# Patient Record
Sex: Male | Born: 1950 | Race: White | Hispanic: No | Smoking: Current every day smoker
Health system: Southern US, Community
[De-identification: ages and names within clinical notes are randomized; demographics above are authoritative.]

## PROBLEM LIST (undated history)

## (undated) DIAGNOSIS — I219 Acute myocardial infarction, unspecified: Secondary | ICD-10-CM

## (undated) DIAGNOSIS — IMO0001 Reserved for inherently not codable concepts without codable children: Secondary | ICD-10-CM

## (undated) DIAGNOSIS — K219 Gastro-esophageal reflux disease without esophagitis: Secondary | ICD-10-CM

## (undated) HISTORY — PX: CARDIAC CATHETERIZATION: SHX172

## (undated) HISTORY — PX: CORONARY ANGIOPLASTY: SHX604

---

## 2015-02-07 ENCOUNTER — Encounter (HOSPITAL_COMMUNITY)
Admission: EM | Disposition: A | Discharge status: Home / Self Care | Payer: Self-pay | Source: Home / Self Care | Attending: Interventional Cardiology

## 2015-02-07 ENCOUNTER — Other Ambulatory Visit: Payer: Self-pay

## 2015-02-07 ENCOUNTER — Encounter (HOSPITAL_COMMUNITY): Payer: Self-pay | Admitting: Emergency Medicine

## 2015-02-07 ENCOUNTER — Inpatient Hospital Stay (HOSPITAL_COMMUNITY)
Admission: EM | Admit: 2015-02-07 | Discharge: 2015-02-09 | DRG: 251 | Disposition: A | Discharge status: Home / Self Care | Payer: Self-pay | Attending: Interventional Cardiology | Admitting: Interventional Cardiology

## 2015-02-07 DIAGNOSIS — E785 Hyperlipidemia, unspecified: Secondary | ICD-10-CM | POA: Diagnosis present

## 2015-02-07 DIAGNOSIS — IMO0001 Reserved for inherently not codable concepts without codable children: Secondary | ICD-10-CM | POA: Diagnosis present

## 2015-02-07 DIAGNOSIS — Z7289 Other problems related to lifestyle: Secondary | ICD-10-CM | POA: Diagnosis present

## 2015-02-07 DIAGNOSIS — F1010 Alcohol abuse, uncomplicated: Secondary | ICD-10-CM | POA: Diagnosis present

## 2015-02-07 DIAGNOSIS — K219 Gastro-esophageal reflux disease without esophagitis: Secondary | ICD-10-CM | POA: Diagnosis present

## 2015-02-07 DIAGNOSIS — I2510 Atherosclerotic heart disease of native coronary artery without angina pectoris: Secondary | ICD-10-CM | POA: Diagnosis present

## 2015-02-07 DIAGNOSIS — I10 Essential (primary) hypertension: Secondary | ICD-10-CM | POA: Diagnosis present

## 2015-02-07 DIAGNOSIS — F1090 Alcohol use, unspecified, uncomplicated: Secondary | ICD-10-CM | POA: Diagnosis present

## 2015-02-07 DIAGNOSIS — Z720 Tobacco use: Secondary | ICD-10-CM

## 2015-02-07 DIAGNOSIS — I2119 ST elevation (STEMI) myocardial infarction involving other coronary artery of inferior wall: Principal | ICD-10-CM | POA: Diagnosis present

## 2015-02-07 DIAGNOSIS — Z789 Other specified health status: Secondary | ICD-10-CM | POA: Diagnosis present

## 2015-02-07 DIAGNOSIS — I252 Old myocardial infarction: Secondary | ICD-10-CM | POA: Diagnosis present

## 2015-02-07 DIAGNOSIS — Z7982 Long term (current) use of aspirin: Secondary | ICD-10-CM

## 2015-02-07 DIAGNOSIS — F17200 Nicotine dependence, unspecified, uncomplicated: Secondary | ICD-10-CM | POA: Diagnosis present

## 2015-02-07 DIAGNOSIS — I2111 ST elevation (STEMI) myocardial infarction involving right coronary artery: Secondary | ICD-10-CM

## 2015-02-07 DIAGNOSIS — I213 ST elevation (STEMI) myocardial infarction of unspecified site: Secondary | ICD-10-CM

## 2015-02-07 DIAGNOSIS — Z955 Presence of coronary angioplasty implant and graft: Secondary | ICD-10-CM

## 2015-02-07 DIAGNOSIS — F17210 Nicotine dependence, cigarettes, uncomplicated: Secondary | ICD-10-CM | POA: Diagnosis present

## 2015-02-07 HISTORY — DX: Acute myocardial infarction, unspecified: I21.9

## 2015-02-07 HISTORY — DX: Gastro-esophageal reflux disease without esophagitis: K21.9

## 2015-02-07 HISTORY — DX: Reserved for inherently not codable concepts without codable children: IMO0001

## 2015-02-07 HISTORY — PX: CARDIAC CATHETERIZATION: SHX172

## 2015-02-07 LAB — CK TOTAL AND CKMB (NOT AT ARMC)
CK, MB: 11.8 ng/mL — AB (ref 0.5–5.0)
Relative Index: 6 — ABNORMAL HIGH (ref 0.0–2.5)
Total CK: 196 U/L (ref 49–397)

## 2015-02-07 LAB — POCT I-STAT, CHEM 8
BUN: 16 mg/dL (ref 6–20)
CALCIUM ION: 1.19 mmol/L (ref 1.13–1.30)
CHLORIDE: 101 mmol/L (ref 101–111)
CREATININE: 1.1 mg/dL (ref 0.61–1.24)
Glucose, Bld: 101 mg/dL — ABNORMAL HIGH (ref 65–99)
HEMATOCRIT: 50 % (ref 39.0–52.0)
Hemoglobin: 17 g/dL (ref 13.0–17.0)
Potassium: 3.8 mmol/L (ref 3.5–5.1)
SODIUM: 140 mmol/L (ref 135–145)
TCO2: 27 mmol/L (ref 0–100)

## 2015-02-07 LAB — CBC WITH DIFFERENTIAL/PLATELET
BASOS ABS: 0 10*3/uL (ref 0.0–0.1)
Basophils Relative: 0 % (ref 0–1)
Eosinophils Absolute: 0.1 10*3/uL (ref 0.0–0.7)
Eosinophils Relative: 1 % (ref 0–5)
HCT: 45.6 % (ref 39.0–52.0)
Hemoglobin: 15.9 g/dL (ref 13.0–17.0)
Lymphocytes Relative: 27 % (ref 12–46)
Lymphs Abs: 2.5 10*3/uL (ref 0.7–4.0)
MCH: 33.9 pg (ref 26.0–34.0)
MCHC: 34.9 g/dL (ref 30.0–36.0)
MCV: 97.2 fL (ref 78.0–100.0)
Monocytes Absolute: 1 10*3/uL (ref 0.1–1.0)
Monocytes Relative: 11 % (ref 3–12)
Neutro Abs: 5.4 10*3/uL (ref 1.7–7.7)
Neutrophils Relative %: 61 % (ref 43–77)
Platelets: 173 10*3/uL (ref 150–400)
RBC: 4.69 MIL/uL (ref 4.22–5.81)
RDW: 13 % (ref 11.5–15.5)
WBC: 9 10*3/uL (ref 4.0–10.5)

## 2015-02-07 LAB — MRSA PCR SCREENING: MRSA by PCR: NEGATIVE

## 2015-02-07 LAB — POCT I-STAT TROPONIN I: Troponin i, poc: 0.05 ng/mL (ref 0.00–0.08)

## 2015-02-07 LAB — TROPONIN I: TROPONIN I: 1.28 ng/mL — AB (ref <0.031)

## 2015-02-07 LAB — POCT ACTIVATED CLOTTING TIME: Activated Clotting Time: 233 seconds

## 2015-02-07 SURGERY — LEFT HEART CATH AND CORONARY ANGIOGRAPHY
Anesthesia: LOCAL

## 2015-02-07 MED ORDER — HEPARIN BOLUS VIA INFUSION
4000.0000 [IU] | Freq: Once | INTRAVENOUS | Status: AC
  Administered 2015-02-07: 4000 [IU] via INTRAVENOUS

## 2015-02-07 MED ORDER — MIDAZOLAM HCL 2 MG/2ML IJ SOLN
INTRAMUSCULAR | Status: AC
  Filled 2015-02-07: qty 2

## 2015-02-07 MED ORDER — SODIUM CHLORIDE 0.9 % IV SOLN: 250.0000 mL | INTRAVENOUS | Status: DC | PRN

## 2015-02-07 MED ORDER — MIDAZOLAM HCL 2 MG/2ML IJ SOLN
INTRAMUSCULAR | Status: DC | PRN
  Administered 2015-02-07: 2 mg via INTRAVENOUS
  Administered 2015-02-07: 1 mg via INTRAVENOUS

## 2015-02-07 MED ORDER — NITROGLYCERIN 1 MG/10 ML FOR IR/CATH LAB
INTRA_ARTERIAL | Status: AC
  Filled 2015-02-07: qty 10

## 2015-02-07 MED ORDER — LORAZEPAM 1 MG PO TABS: 0.0000 mg | ORAL_TABLET | Freq: Four times a day (QID) | ORAL | Status: DC

## 2015-02-07 MED ORDER — POTASSIUM CHLORIDE CRYS ER 20 MEQ PO TBCR
20.0000 meq | EXTENDED_RELEASE_TABLET | Freq: Every day | ORAL | Status: DC
  Administered 2015-02-08 – 2015-02-09 (×2): 20 meq via ORAL
  Filled 2015-02-07 (×3): qty 1

## 2015-02-07 MED ORDER — ASPIRIN EC 81 MG PO TBEC
81.0000 mg | DELAYED_RELEASE_TABLET | Freq: Every day | ORAL | Status: DC
  Administered 2015-02-08: 81 mg via ORAL
  Filled 2015-02-07: qty 1

## 2015-02-07 MED ORDER — TICAGRELOR 90 MG PO TABS
ORAL_TABLET | ORAL | Status: DC | PRN
  Administered 2015-02-07: 180 mg via ORAL

## 2015-02-07 MED ORDER — FENTANYL CITRATE (PF) 100 MCG/2ML IJ SOLN
INTRAMUSCULAR | Status: AC
  Filled 2015-02-07: qty 2

## 2015-02-07 MED ORDER — NITROGLYCERIN IN D5W 200-5 MCG/ML-% IV SOLN
5.0000 ug/min | INTRAVENOUS | Status: DC
  Administered 2015-02-07: 5 ug/min via INTRAVENOUS
  Filled 2015-02-07: qty 250

## 2015-02-07 MED ORDER — SODIUM CHLORIDE 0.9 % IJ SOLN
3.0000 mL | Freq: Two times a day (BID) | INTRAMUSCULAR | Status: DC
  Administered 2015-02-07 – 2015-02-09 (×4): 3 mL via INTRAVENOUS

## 2015-02-07 MED ORDER — THIAMINE HCL 100 MG/ML IJ SOLN
100.0000 mg | Freq: Every day | INTRAMUSCULAR | Status: DC
  Administered 2015-02-09: 100 mg via INTRAVENOUS
  Filled 2015-02-07 (×3): qty 1

## 2015-02-07 MED ORDER — VITAMIN B-1 100 MG PO TABS
100.0000 mg | ORAL_TABLET | Freq: Every day | ORAL | Status: DC
  Administered 2015-02-07 – 2015-02-08 (×2): 100 mg via ORAL
  Filled 2015-02-07 (×3): qty 1

## 2015-02-07 MED ORDER — ADULT MULTIVITAMIN W/MINERALS CH
1.0000 | ORAL_TABLET | Freq: Every day | ORAL | Status: DC
  Administered 2015-02-07 – 2015-02-09 (×3): 1 via ORAL
  Filled 2015-02-07 (×4): qty 1

## 2015-02-07 MED ORDER — FENTANYL CITRATE (PF) 100 MCG/2ML IJ SOLN
INTRAMUSCULAR | Status: DC | PRN
  Administered 2015-02-07 (×2): 25 ug via INTRAVENOUS

## 2015-02-07 MED ORDER — ONDANSETRON HCL 4 MG/2ML IJ SOLN: 4.0000 mg | Freq: Four times a day (QID) | INTRAMUSCULAR | Status: DC | PRN

## 2015-02-07 MED ORDER — LORAZEPAM 1 MG PO TABS: 1.0000 mg | ORAL_TABLET | Freq: Four times a day (QID) | ORAL | Status: DC | PRN

## 2015-02-07 MED ORDER — NITROGLYCERIN 0.4 MG SL SUBL
0.4000 mg | SUBLINGUAL_TABLET | SUBLINGUAL | Status: DC | PRN
Start: 2015-02-07 — End: 2015-02-09

## 2015-02-07 MED ORDER — LORAZEPAM 2 MG/ML IJ SOLN: 1.0000 mg | Freq: Four times a day (QID) | INTRAMUSCULAR | Status: DC | PRN

## 2015-02-07 MED ORDER — METOPROLOL TARTRATE 12.5 MG HALF TABLET
12.5000 mg | ORAL_TABLET | Freq: Two times a day (BID) | ORAL | Status: DC
  Administered 2015-02-07 – 2015-02-09 (×3): 12.5 mg via ORAL
  Filled 2015-02-07 (×5): qty 1

## 2015-02-07 MED ORDER — IOHEXOL 350 MG/ML SOLN
INTRAVENOUS | Status: DC | PRN
  Administered 2015-02-07: 155 mL via INTRACARDIAC

## 2015-02-07 MED ORDER — ATORVASTATIN CALCIUM 80 MG PO TABS
80.0000 mg | ORAL_TABLET | Freq: Every day | ORAL | Status: DC
  Administered 2015-02-07 – 2015-02-08 (×2): 80 mg via ORAL
  Filled 2015-02-07 (×3): qty 1

## 2015-02-07 MED ORDER — ACETAMINOPHEN 325 MG PO TABS
650.0000 mg | ORAL_TABLET | ORAL | Status: DC | PRN
  Administered 2015-02-07: 650 mg via ORAL
  Filled 2015-02-07: qty 2

## 2015-02-07 MED ORDER — FOLIC ACID 1 MG PO TABS
1.0000 mg | ORAL_TABLET | Freq: Every day | ORAL | Status: DC
  Administered 2015-02-07 – 2015-02-09 (×3): 1 mg via ORAL
  Filled 2015-02-07 (×4): qty 1

## 2015-02-07 MED ORDER — HEPARIN SODIUM (PORCINE) 5000 UNIT/ML IJ SOLN
INTRAMUSCULAR | Status: AC
  Administered 2015-02-07: 4000 [IU]
  Filled 2015-02-07: qty 1

## 2015-02-07 MED ORDER — HEPARIN SODIUM (PORCINE) 1000 UNIT/ML IJ SOLN
INTRAMUSCULAR | Status: DC | PRN
  Administered 2015-02-07: 5000 [IU] via INTRAVENOUS
  Administered 2015-02-07: 3000 [IU] via INTRAVENOUS

## 2015-02-07 MED ORDER — LIDOCAINE HCL (PF) 1 % IJ SOLN
INTRAMUSCULAR | Status: DC | PRN
  Administered 2015-02-07: 2 mL via SUBCUTANEOUS

## 2015-02-07 MED ORDER — TICAGRELOR 90 MG PO TABS
ORAL_TABLET | ORAL | Status: AC
  Filled 2015-02-07: qty 2

## 2015-02-07 MED ORDER — LIDOCAINE HCL (PF) 1 % IJ SOLN
INTRAMUSCULAR | Status: DC | PRN
  Administered 2015-02-07: 18:00:00

## 2015-02-07 MED ORDER — PANTOPRAZOLE SODIUM 40 MG PO TBEC
40.0000 mg | DELAYED_RELEASE_TABLET | Freq: Every day | ORAL | Status: DC
  Administered 2015-02-07 – 2015-02-09 (×3): 40 mg via ORAL
  Filled 2015-02-07 (×3): qty 1

## 2015-02-07 MED ORDER — HEPARIN SODIUM (PORCINE) 1000 UNIT/ML IJ SOLN
INTRAMUSCULAR | Status: AC
  Filled 2015-02-07: qty 1

## 2015-02-07 MED ORDER — LORAZEPAM 1 MG PO TABS: 0.0000 mg | ORAL_TABLET | Freq: Two times a day (BID) | ORAL | Status: DC

## 2015-02-07 MED ORDER — VERAPAMIL HCL 2.5 MG/ML IV SOLN
INTRAVENOUS | Status: DC | PRN
  Administered 2015-02-07: 17:00:00 via INTRA_ARTERIAL

## 2015-02-07 MED ORDER — HEPARIN (PORCINE) IN NACL 2-0.9 UNIT/ML-% IJ SOLN
INTRAMUSCULAR | Status: AC
  Filled 2015-02-07: qty 1500

## 2015-02-07 MED ORDER — SODIUM CHLORIDE 0.9 % IJ SOLN: 3.0000 mL | INTRAMUSCULAR | Status: DC | PRN

## 2015-02-07 MED ORDER — LIDOCAINE HCL (PF) 1 % IJ SOLN
INTRAMUSCULAR | Status: AC
  Filled 2015-02-07: qty 30

## 2015-02-07 MED ORDER — TICAGRELOR 90 MG PO TABS
90.0000 mg | ORAL_TABLET | Freq: Two times a day (BID) | ORAL | Status: DC
  Administered 2015-02-08 – 2015-02-09 (×3): 90 mg via ORAL
  Filled 2015-02-07 (×5): qty 1

## 2015-02-07 MED ORDER — SODIUM CHLORIDE 0.9 % WEIGHT BASED INFUSION
1.0000 mL/kg/h | INTRAVENOUS | Status: AC
Start: 2015-02-07 — End: 2015-02-07

## 2015-02-07 SURGICAL SUPPLY — 17 items
BALLN EUPHORA RX 2.0X12 (BALLOONS) ×3
BALLOON EUPHORA RX 2.0X12 (BALLOONS) ×2 IMPLANT
CATH INFINITI 5 FR JL3.5 (CATHETERS) ×3 IMPLANT
CATH INFINITI 5FR ANG PIGTAIL (CATHETERS) ×3 IMPLANT
CATH INFINITI JR4 5F (CATHETERS) ×3 IMPLANT
DEVICE RAD COMP TR BAND LRG (VASCULAR PRODUCTS) ×3 IMPLANT
GLIDESHEATH SLEND SS 6F .021 (SHEATH) ×6 IMPLANT
GUIDE CATH RUNWAY 6FR FR4 (CATHETERS) ×3 IMPLANT
KIT ENCORE 26 ADVANTAGE (KITS) ×3 IMPLANT
KIT HEART LEFT (KITS) ×3 IMPLANT
PACK CARDIAC CATHETERIZATION (CUSTOM PROCEDURE TRAY) ×3 IMPLANT
SYR MEDRAD MARK V 150ML (SYRINGE) ×3 IMPLANT
TRANSDUCER W/STOPCOCK (MISCELLANEOUS) ×3 IMPLANT
TUBING CIL FLEX 10 FLL-RA (TUBING) ×3 IMPLANT
VALVE GUARDIAN II ~~LOC~~ HEMO (MISCELLANEOUS) ×3 IMPLANT
WIRE ASAHI PROWATER 180CM (WIRE) ×3 IMPLANT
WIRE SAFE-T 1.5MM-J .035X260CM (WIRE) ×3 IMPLANT

## 2015-02-07 NOTE — ED Notes (Signed)
Per EMS- Pt here with CP sudden onset 1 hour ago. Pt reports severe pressure, minimal relief with nirto. Pt endorses diaphoresis, SOB, diaphoreses. Pt recd 4 nitro, 324 ASA PTA. Pt has hx MI with 3 stents placed 4 years ago.

## 2015-02-07 NOTE — ED Notes (Signed)
CODE STEMI ACTIVATED @ 1630

## 2015-02-07 NOTE — H&P (Signed)
Patient ID: Noah Walker MRN: [id number], DOB/AGE: 09/11/1950   Admit date: 02/07/2015   Primary Physician: No primary care provider on file. Primary Cardiologist: Dr. Constance Haw [facility] cardiology at [facility]  Pt. Profile:  Noah Walker is a 64 y.o. male with known CAD s/p 3 stent  who presented to [facility] ED for evaluation of chest pain x 1 hour.    HPI: As above. The patient was presented to [facility] ED for evaluation of substernal chest pain x 1 hour, described as aching that radiated to his left arm. In ED he received 4 nitro and 324mg  ASA.The pain is constant and rates 8/10. He takes ASA everyday. He denies any SOB or palpitation.   His EKG showed ST elevated in inferior lead. Lytes are normal. POC trop normal. Cath 2015 showed mild nonobstructive disease, restenosis of RCA stent and EF of 45-505. Patient lives by him self. Wife died 5 years. Son is here but unable to provide detailed history, only think he know that patient smokes about 1-2 pack a day and alcohol drinking. He live nearby to him.   Problem List  Past Medical History  Diagnosis Date  . MI (myocardial infarction)   . Reflux     Past Surgical History  Procedure Laterality Date  . Cardiac catheterization       Allergies  No Known Allergies   Home Medications  Prior to Admission medications   Medication Sig Start Date End Date Taking? Authorizing Provider  aspirin 81 MG tablet Take 81 mg by mouth daily.   Yes Historical Provider, MD  nitroGLYCERIN (NITROSTAT) 0.4 MG SL tablet Place 0.4 mg under the tongue every 5 (five) minutes as needed for chest pain.   Yes Historical Provider, MD  Omega-3 Fatty Acids (FISH OIL) 1000 MG CAPS Take 1,000 mg by mouth daily.   Yes Historical Provider, MD  omeprazole (PRILOSEC) 20 MG capsule Take 20 mg by mouth daily.   Yes Historical Provider, MD  potassium chloride SA (K-DUR,KLOR-CON) 20 MEQ tablet Take 20 mEq by mouth daily.   Yes Historical Provider, MD    Family  History  History reviewed. No pertinent family history. No family status information on file.     Social History  History   Social History  . Marital Status: N/A    Spouse Name: N/A  . Number of Children: N/A  . Years of Education: N/A   Occupational History  . Not on file.   Social History Main Topics  . Smoking status: Current Every Day Smoker -- 1.00 packs/day    Types: Cigarettes  . Smokeless tobacco: Not on file  . Alcohol Use: Yes  . Drug Use: No  . Sexual Activity: Not on file   Other Topics Concern  . Not on file   Social History Narrative  . No narrative on file      All other systems reviewed and are otherwise negative except as noted above.  Physical Exam  Pulse 62, temperature 98.4 F (36.9 C), temperature source Oral, resp. rate 18, height 5\' 11"  (1.803 m), weight 220 lb (99.791 kg), SpO2 98 %.  General: Pleasant, NAD Psych: Normal affect. Neuro: Alert and oriented X 3. Moves all extremities spontaneously. HEENT: Normal  Neck: Supple without bruits or JVD. Lungs:  Resp regular and unlabored, CTA. Heart: RRR no s3, s4, or murmurs. Abdomen: Soft, non-tender, non-distended, BS + x 4.  Extremities: No clubbing, cyanosis or edema. DP/PT/Radials 2+ and equal bilaterally.  Labs  No results for input(s): CKTOTAL, CKMB, TROPONINI in the last 72 hours. Lab Results  Component Value Date   HGB 17.0 02/07/2015   HCT 50.0 02/07/2015    Recent Labs Lab 02/07/15 1645  NA 140  K 3.8  CL 101  BUN 16  CREATININE 1.10  GLUCOSE 101*    Radiology/Studies  No results found.  ECG: ST elevation of inferior lead.  ASSESSMENT AND PLAN  1. Inferior STEMI - Presented with 1 hour of chest pain. Hx of 2 cath in past one in 2012 and second in 2015.  - He was started on heparin bolus and infusion.  - Will get Lipid panel, cycle trop, serial ekgs, HgbA1C, echocardiogram, UDS - Will admit to ICU  2. Alcohol abuse - Son provided hx of alcohol  abuse. -Will place him on a CIWA protocol.  3. Current tobacco abuse - Need education  Kathalene Frames 02/07/2015, 4:54 PM Pager [phone]   I have examined the patient and reviewed assessment and plan and discussed with patient.  Agree with above as stated.  Plan for emergent cardiac catheterization. Further plans based on results of cath. Of note, he has had issues with taking long-term antiplatelets therapy in the past. He has also had irregular follow-up with his cardiologist. This may limit our revascularization options. Likely to the CCU postprocedure.  Eliaz Fout S.

## 2015-02-07 NOTE — ED Notes (Signed)
Pt transported to cath lab. Maralyn Sago, RN to transport.

## 2015-02-07 NOTE — ED Provider Notes (Signed)
CSN: [id number]     Arrival date & time 02/07/15  1629 History   First MD Initiated Contact with Patient 02/07/15 1631     Chief Complaint  Patient presents with  . Chest Pain     (Consider location/radiation/quality/duration/timing/severity/associated sxs/prior Treatment) Patient is a 64 y.o. male presenting with chest pain. The history is provided by the patient (the pt complains of chest pain for one hour,  hx of 3 stents).  Chest Pain Pain location:  Substernal area Pain quality: aching   Pain radiates to:  L arm Pain radiates to the back: yes   Pain severity:  Moderate Onset quality:  Sudden Timing:  Constant Chronicity:  New Context: not breathing   Associated symptoms: no abdominal pain, no back pain, no cough, no fatigue and no headache     No past medical history on file. No past surgical history on file. No family history on file. History  Substance Use Topics  . Smoking status: Not on file  . Smokeless tobacco: Not on file  . Alcohol Use: Not on file    Review of Systems  Constitutional: Negative for appetite change and fatigue.  HENT: Negative for congestion, ear discharge and sinus pressure.   Eyes: Negative for discharge.  Respiratory: Negative for cough.   Cardiovascular: Positive for chest pain.  Gastrointestinal: Negative for abdominal pain and diarrhea.  Genitourinary: Negative for frequency and hematuria.  Musculoskeletal: Negative for back pain.  Skin: Negative for rash.  Neurological: Negative for seizures and headaches.  Psychiatric/Behavioral: Negative for hallucinations.      Allergies  Review of patient's allergies indicates not on file.  Home Medications   Prior to Admission medications   Not on File   SpO2 95% Physical Exam  Constitutional: He is oriented to person, place, and time. He appears well-developed.  HENT:  Head: Normocephalic.  Eyes: Conjunctivae and EOM are normal. No scleral icterus.  Neck: Neck supple. No  thyromegaly present.  Cardiovascular: Normal rate and regular rhythm.  Exam reveals no gallop and no friction rub.   No murmur heard. Pulmonary/Chest: No stridor. He has no wheezes. He has no rales. He exhibits no tenderness.  Abdominal: He exhibits no distension. There is no tenderness. There is no rebound.  Musculoskeletal: Normal range of motion. He exhibits no edema.  Lymphadenopathy:    He has no cervical adenopathy.  Neurological: He is oriented to person, place, and time. He exhibits normal muscle tone. Coordination normal.  Skin: No rash noted. No erythema.  Psychiatric: He has a normal mood and affect. His behavior is normal.    ED Course  Procedures (including critical care time) Labs Review Labs Reviewed - No data to display  Imaging Review No results found.  ekg shows acute stemi inf mi CRITICAL CARE Performed by: Amena Dockham L Total critical care time: 10 Critical care time was exclusive of separately billable procedures and treating other patients. Critical care was necessary to treat or prevent imminent or life-threatening deterioration. Critical care was time spent personally by me on the following activities: development of treatment plan with patient and/or surrogate as well as nursing, discussions with consultants, evaluation of patient's response to treatment, examination of patient, obtaining history from patient or surrogate, ordering and performing treatments and interventions, ordering and review of laboratory studies, ordering and review of radiographic studies, pulse oximetry and re-evaluation of patient's condition.  MDM   Final diagnoses:  None    STEmi  To cath lab    [facility],  MD 02/07/15 1645

## 2015-02-08 ENCOUNTER — Encounter (HOSPITAL_COMMUNITY): Payer: Self-pay | Admitting: Interventional Cardiology

## 2015-02-08 DIAGNOSIS — E785 Hyperlipidemia, unspecified: Secondary | ICD-10-CM

## 2015-02-08 LAB — CK TOTAL AND CKMB (NOT AT ARMC)
CK TOTAL: 343 U/L (ref 49–397)
CK, MB: 29.4 ng/mL — ABNORMAL HIGH (ref 0.5–5.0)
CK, MB: 32.1 ng/mL — ABNORMAL HIGH (ref 0.5–5.0)
RELATIVE INDEX: 9.4 — AB (ref 0.0–2.5)
Relative Index: 8.3 — ABNORMAL HIGH (ref 0.0–2.5)
Total CK: 354 U/L (ref 49–397)

## 2015-02-08 LAB — CBC
HCT: 45 % (ref 39.0–52.0)
HEMATOCRIT: 44.1 % (ref 39.0–52.0)
Hemoglobin: 15.2 g/dL (ref 13.0–17.0)
Hemoglobin: 15.4 g/dL (ref 13.0–17.0)
MCH: 33.9 pg (ref 26.0–34.0)
MCH: 33.2 pg (ref 26.0–34.0)
MCHC: 34.5 g/dL (ref 30.0–36.0)
MCHC: 34.2 g/dL (ref 30.0–36.0)
MCV: 98.2 fL (ref 78.0–100.0)
MCV: 97 fL (ref 78.0–100.0)
PLATELETS: 139 10*3/uL — AB (ref 150–400)
Platelets: 151 10*3/uL (ref 150–400)
RBC: 4.49 MIL/uL (ref 4.22–5.81)
RBC: 4.64 MIL/uL (ref 4.22–5.81)
RDW: 13 % (ref 11.5–15.5)
RDW: 13.1 % (ref 11.5–15.5)
WBC: 8.8 10*3/uL (ref 4.0–10.5)
WBC: 8.2 10*3/uL (ref 4.0–10.5)

## 2015-02-08 LAB — LIPID PANEL
CHOLESTEROL: 176 mg/dL (ref 0–200)
HDL: 37 mg/dL — ABNORMAL LOW (ref >40)
LDL Cholesterol: 113 mg/dL — ABNORMAL HIGH (ref 0–99)
Total CHOL/HDL Ratio: 4.8 RATIO
Triglycerides: 131 mg/dL (ref <150)
VLDL: 26 mg/dL (ref 0–40)

## 2015-02-08 LAB — RAPID URINE DRUG SCREEN, HOSP PERFORMED
Amphetamines: NOT DETECTED
BARBITURATES: NOT DETECTED
Benzodiazepines: POSITIVE — AB
COCAINE: NOT DETECTED
OPIATES: NOT DETECTED
TETRAHYDROCANNABINOL: NOT DETECTED

## 2015-02-08 LAB — BASIC METABOLIC PANEL
Anion gap: 9 (ref 5–15)
BUN: 11 mg/dL (ref 6–20)
CHLORIDE: 103 mmol/L (ref 101–111)
CO2: 22 mmol/L (ref 22–32)
Calcium: 8.8 mg/dL — ABNORMAL LOW (ref 8.9–10.3)
Creatinine, Ser: 0.93 mg/dL (ref 0.61–1.24)
GFR calc Af Amer: >60 mL/min (ref >60)
GFR calc non Af Amer: >60 mL/min (ref >60)
Glucose, Bld: 90 mg/dL (ref 65–99)
Potassium: 3.5 mmol/L (ref 3.5–5.1)
SODIUM: 134 mmol/L — AB (ref 135–145)

## 2015-02-08 MED ORDER — AMLODIPINE BESYLATE 5 MG PO TABS
5.0000 mg | ORAL_TABLET | Freq: Every day | ORAL | Status: DC
  Administered 2015-02-08 – 2015-02-09 (×2): 5 mg via ORAL
  Filled 2015-02-08 (×3): qty 1

## 2015-02-08 MED ORDER — PNEUMOCOCCAL VAC POLYVALENT 25 MCG/0.5ML IJ INJ
0.5000 mL | INJECTION | INTRAMUSCULAR | Status: DC
  Filled 2015-02-08: qty 0.5

## 2015-02-08 MED FILL — Heparin Sodium (Porcine) 2 Unit/ML in Sodium Chloride 0.9%: INTRAMUSCULAR | Qty: 500 | Status: AC

## 2015-02-08 MED FILL — Nitroglycerin IV Soln 100 MCG/ML in D5W: INTRA_ARTERIAL | Qty: 10 | Status: AC

## 2015-02-08 NOTE — Progress Notes (Signed)
Primary cardiology  = [facility] in [place],  [facility] in [place]   PROGRESS NOTE  Subjective:   Noah Walker is a 64 y.o. male with known CAD s/p 3 stent who presented to [facility] ED for evaluation of chest pain x 1 hour.   He had PCI  of the PL branch - only partially successful.     Cath revealed:  Mid RCA lesion, 20% in stent restenosis.  Prox LAD to Mid LAD lesion, 50% stenosed.  1st RPLB lesion, 100% stenosed which was the culprit for his presentation. He was treated with balloon angioplasty and his symptoms improved. Please see below.  The left ventricular systolic function is normal.                                                              He had PCI  of the PL branch - only partially successful.  He is feeling well this am     Objective:    Vital Signs:   Temp:  [97.7 F (36.5 C)-98.6 F (37 C)] 97.7 F (36.5 C) (07/14 0800) Pulse Rate:  [0-69] 62 (07/14 0902) Resp:  [0-31] 13 (07/13 2100) BP: (122-168)/(49-99) 154/91 mmHg (07/14 0902) SpO2:  [0 %-100 %] 94 % (07/14 0800) Weight:  [99.791 kg (220 lb)] 99.791 kg (220 lb) (07/13 1635)      24-hour weight change: Weight change:   Weight trends: Filed Weights   02/07/15 1635  Weight: 99.791 kg (220 lb)    Intake/Output:  07/13 0701 - 07/14 0700 In: 1124.7 [P.O.:720; I.V.:404.7] Out: -  Total I/O In: 240 [P.O.:240] Out: -    Physical Exam: BP 154/91 mmHg  Pulse 62  Temp(Src) 97.7 F (36.5 C) (Oral)  Resp 13  Ht 5\' 11"  (1.803 m)  Wt 99.791 kg (220 lb)  BMI 30.70 kg/m2  SpO2 94%  Wt Readings from Last 3 Encounters:  02/07/15 99.791 kg (220 lb)    General: Vital signs reviewed and noted.   Head: Normocephalic, atraumatic.  Eyes: conjunctivae/corneas clear.  EOM's intact.   Throat: normal  Neck:  normal   Lungs:    clear   Heart:   RR   Abdomen:  Soft, non-tender, non-distended    Extremities: Right radial cath site is ok    Neurologic: A&O X3, CN II - XII are grossly  intact.   Psych: Normal     Labs: BMET:  Recent Labs  02/07/15 1645 02/08/15 0132  NA 140 134*  K 3.8 3.5  CL 101 103  CO2  --  22  GLUCOSE 101* 90  BUN 16 11  CREATININE 1.10 0.93  CALCIUM  --  8.8*    Liver function tests: No results for input(s): AST, ALT, ALKPHOS, BILITOT, PROT, ALBUMIN in the last 72 hours. No results for input(s): LIPASE, AMYLASE in the last 72 hours.  CBC:  Recent Labs  02/07/15 1635  02/08/15 0250 02/08/15 0743  WBC 9.0  --  8.8 8.2  NEUTROABS 5.4  --   --   --   HGB 15.9  < > 15.2 15.4  HCT 45.6  < > 44.1 45.0  MCV 97.2  --  98.2 97.0  PLT 173  --  139* 151  < > = values in this  interval not displayed.  Cardiac Enzymes:  Recent Labs  02/07/15 1947 02/08/15 0132  CKTOTAL 196 354  CKMB 11.8* 29.4*  TROPONINI 1.28*  --     Coagulation Studies: No results for input(s): LABPROT, INR in the last 72 hours.  Other: Invalid input(s): POCBNP No results for input(s): DDIMER in the last 72 hours. No results for input(s): HGBA1C in the last 72 hours.  Recent Labs  02/08/15 0132  CHOL 176  HDL 37*  LDLCALC 113*  TRIG 131  CHOLHDL 4.8   No results for input(s): TSH, T4TOTAL, T3FREE, THYROIDAB in the last 72 hours.  Invalid input(s): FREET3 No results for input(s): VITAMINB12, FOLATE, FERRITIN, TIBC, IRON, RETICCTPCT in the last 72 hours.   Other results:  EKG  ( personally reviewed )  -sinus brady at 53.  Small inf . Q waves     Medications:    Infusions: . nitroGLYCERIN Stopped (02/07/15 2100)    Scheduled Medications: . aspirin EC  81 mg Oral Daily  . atorvastatin  80 mg Oral q1800  . folic acid  1 mg Oral Daily  . LORazepam  0-4 mg Oral Q6H   Followed by  . [START ON 02/09/2015] LORazepam  0-4 mg Oral Q12H  . metoprolol tartrate  12.5 mg Oral BID  . multivitamin with minerals  1 tablet Oral Daily  . pantoprazole  40 mg Oral Daily  . [START ON 02/09/2015] pneumococcal 23 valent vaccine  0.5 mL Intramuscular  Tomorrow-1000  . potassium chloride SA  20 mEq Oral Daily  . sodium chloride  3 mL Intravenous Q12H  . thiamine  100 mg Oral Daily   Or  . thiamine  100 mg Intravenous Daily  . ticagrelor  90 mg Oral BID    Assessment/ Plan:   Active Problems:   Acute inferior myocardial infarction  1. Acute Inf. MI:  S/p angioplasty of the small PL branch. Was not stented  Patient is feeling well today  Transfer to floor. Anticipate DC tomorrow  Continue asa and brilinta for 1 month   2. Hyperlipidemia:  LDL is 113.  Has been started on atorvastatin 80 .     Disposition: anticipate DC tomorrow  Length of Stay: 1  Vesta Mixer, Montez Hageman., MD, [facility] 02/08/2015, 9:40 AM Office [phone] Pager [phone]

## 2015-02-08 NOTE — Progress Notes (Signed)
Patient ambulated 4 laps around the unit with cardiac rehad nurse. Upon completion of ambulation patient c/o chest pain rating 2/10 associated with dizziness and nausea. BP 138/97 HR 53 with feeling of inability to catch breath at times. Corine Shelter PA notified and made aware of the incident and that the chest pain has now subsided. Discharge has been cancelled and orders have been entered for Norvasc. Will continue to monitor patient.

## 2015-02-08 NOTE — Progress Notes (Signed)
Utilization Review Completed.Derico Mitton T7/14/2016  

## 2015-02-08 NOTE — Progress Notes (Signed)
CARDIAC REHAB PHASE I   PRE:  Rate/Rhythm: 52 SB  BP:  Sitting: 139/86        SaO2: 96 RA  MODE:  Ambulation: 1400 ft  POST:  Rate/Rhythm: 69 SR  BP:  Sitting: 138/97       SaO2: 98 RA  Pt lying in bed, states he is tired. Pt states "you people don't let me sleep." Agreeable to ambulate. Pt ambulated 1400 ft on RA, independent, brisk, steady gait, declined rest stop. Pt tolerated ambulation well however, upon returning to room  pt began to c/o of 2/10 chest pain and moderate dizziness, RN notified. Pt cp resolved with rest. Pt denies DOE but does state he has felt short of breath at rest, like he can't catch his breath. Described as breathlessness, may be a side effect of his brilinta, advised pt to discuss this with MD. Completed MI/PCI education.  Reviewed risk factors, tobacco cessation, anti-platelet therapy, activity restrictions, ntg, exercise, heart healthy diet, and phase 2 cardiac rehab. Pt verbalized understanding. Pt declines  phase 2 cardiac rehab, states he is not interested and cannot afford to miss work. Pt reports hx of non-compliance with diet, medications, also states he smokes a pack of cigarettes a day (previously quit for 6 months after his first heart attack) and sometimes drinks 10 beers a night for several days in a row. Pt states "I should not be alive. After my wife died I stopped taking care of myself."  Pt verbalized understanding of importance of medication compliance and lifestyle changes. Pt states he has no other questions at this time. Pt to bed after walk per pt request, call bell within reach. Will follow-up tomorrow.    [id number]  Joylene Grapes, RN, BSN 02/08/2015 12:47 PM

## 2015-02-08 NOTE — Progress Notes (Signed)
Spoke w pt. He lives in [place] He has no ins at present. Have him inform on [facility] [place] and also on [place] [facility] and inform [facility]. Gave pt 30day free brilinta card. Placed brilinta pt assist form on shadow chart for md to sign.

## 2015-02-09 ENCOUNTER — Encounter (HOSPITAL_COMMUNITY): Payer: Self-pay | Admitting: Interventional Cardiology

## 2015-02-09 DIAGNOSIS — Z7289 Other problems related to lifestyle: Secondary | ICD-10-CM

## 2015-02-09 DIAGNOSIS — F17200 Nicotine dependence, unspecified, uncomplicated: Secondary | ICD-10-CM

## 2015-02-09 DIAGNOSIS — Z789 Other specified health status: Secondary | ICD-10-CM

## 2015-02-09 DIAGNOSIS — I10 Essential (primary) hypertension: Secondary | ICD-10-CM | POA: Diagnosis present

## 2015-02-09 DIAGNOSIS — F1090 Alcohol use, unspecified, uncomplicated: Secondary | ICD-10-CM

## 2015-02-09 DIAGNOSIS — E785 Hyperlipidemia, unspecified: Secondary | ICD-10-CM | POA: Diagnosis present

## 2015-02-09 DIAGNOSIS — IMO0001 Reserved for inherently not codable concepts without codable children: Secondary | ICD-10-CM

## 2015-02-09 HISTORY — DX: Hyperlipidemia, unspecified: E78.5

## 2015-02-09 HISTORY — DX: Reserved for inherently not codable concepts without codable children: IMO0001

## 2015-02-09 HISTORY — DX: Essential (primary) hypertension: I10

## 2015-02-09 HISTORY — DX: Alcohol use, unspecified, uncomplicated: F10.90

## 2015-02-09 HISTORY — DX: Nicotine dependence, unspecified, uncomplicated: F17.200

## 2015-02-09 HISTORY — DX: Other problems related to lifestyle: Z72.89

## 2015-02-09 HISTORY — DX: Other specified health status: Z78.9

## 2015-02-09 LAB — HEMOGLOBIN A1C
HEMOGLOBIN A1C: 5.4 % (ref 4.8–5.6)
Mean Plasma Glucose: 108 mg/dL

## 2015-02-09 MED ORDER — PRAVASTATIN SODIUM 40 MG PO TABS: 40.0000 mg | ORAL_TABLET | Freq: Every day | ORAL | Status: DC

## 2015-02-09 MED ORDER — FOLIC ACID 1 MG PO TABS: 1.0000 mg | ORAL_TABLET | Freq: Every day | ORAL | Status: DC

## 2015-02-09 MED ORDER — THIAMINE HCL 100 MG PO TABS: 100.0000 mg | ORAL_TABLET | Freq: Every day | ORAL | Status: DC

## 2015-02-09 MED ORDER — TICAGRELOR 90 MG PO TABS: 90.0000 mg | ORAL_TABLET | Freq: Two times a day (BID) | ORAL | Status: DC

## 2015-02-09 MED ORDER — ADULT MULTIVITAMIN W/MINERALS CH: 1.0000 | ORAL_TABLET | Freq: Every day | ORAL | Status: DC

## 2015-02-09 MED ORDER — AMLODIPINE BESYLATE 5 MG PO TABS: 5.0000 mg | ORAL_TABLET | Freq: Every day | ORAL | Status: DC

## 2015-02-09 MED ORDER — METOPROLOL TARTRATE 25 MG PO TABS: 12.5000 mg | ORAL_TABLET | Freq: Two times a day (BID) | ORAL | Status: DC

## 2015-02-09 NOTE — Progress Notes (Signed)
Orde for discharge verified and pt informed and verbalized understanding. Discharge instructions gone over with pt and this evening's med outlined, pt verbalized understanding. Reiterated the importance of taking meds: i.e. Brilinta. IV access discontinued. Elink notified. Pt transported to waiting car via wheelchair with belongings. Will cont to monitor

## 2015-02-09 NOTE — Progress Notes (Signed)
1130 Observed pt up in hall walking with his RN. Education was completed yesterday with pt. Read note that pt wants to go home. Luetta Nutting RN BSN 02/09/2015 11:37 AM

## 2015-02-09 NOTE — Progress Notes (Addendum)
Subjective:    Day of hospitalization: 2  VSS.  No overnight events.  Pt denies further episodes of CP.  Walked 2 laps around the unit.  He wants to go home.    Objective:   Temp:  [97.7 F (36.5 C)-98.1 F (36.7 C)] 97.7 F (36.5 C) (07/15 0734) Pulse Rate:  [50-63] 56 (07/15 1000) Resp:  [20] 20 (07/15 0800) BP: (117-163)/(52-97) 134/55 mmHg (07/15 1006) SpO2:  [93 %-100 %] 96 % (07/15 1000) Last BM Date: 02/08/15  Filed Weights   02/07/15 1635  Weight: 99.791 kg (220 lb)    Intake/Output Summary (Last 24 hours) at 02/09/15 1133 Last data filed at 02/09/15 0800  Gross per 24 hour  Intake   1055 ml  Output      0 ml  Net   1055 ml    Physical Exam: General: NAD, sitting up in the chair.  HEENT: Conjunctiva and lids normal, oropharynx clear. Lungs: CTAB, nonlabored. Cardiac: RRR, no m/r/g. Abdomen: +BS, NT/ND.   Extremities: No LE edema. Radial cath site without hematoma. Neuro: Alert and oriented x3. Moving all extremities.   Lab Results:  Basic Metabolic Panel:  Recent Labs Lab 02/07/15 1645 02/08/15 0132  NA 140 134*  K 3.8 3.5  CL 101 103  CO2  --  22  GLUCOSE 101* 90  BUN 16 11  CREATININE 1.10 0.93  CALCIUM  --  8.8*    Liver Function Tests: No results for input(s): AST, ALT, ALKPHOS, BILITOT, PROT, ALBUMIN in the last 168 hours.  CBC:  Recent Labs Lab 02/07/15 1635 02/07/15 1645 02/08/15 0250 02/08/15 0743  WBC 9.0  --  8.8 8.2  HGB 15.9 17.0 15.2 15.4  HCT 45.6 50.0 44.1 45.0  MCV 97.2  --  98.2 97.0  PLT 173  --  139* 151    Cardiac Enzymes:  Recent Labs Lab 02/07/15 1947 02/08/15 0132 02/08/15 0743  CKTOTAL 196 354 343  CKMB 11.8* 29.4* 32.1*  TROPONINI 1.28*  --   --     BNP: No results for input(s): PROBNP in the last 8760 hours.  Coagulation: No results for input(s): INR in the last 168 hours.  Radiology: No results found.   ECG:   Medications:   Scheduled Medications: . amLODipine  5 mg Oral  Daily  . atorvastatin  80 mg Oral q1800  . folic acid  1 mg Oral Daily  . LORazepam  0-4 mg Oral Q6H   Followed by  . LORazepam  0-4 mg Oral Q12H  . metoprolol tartrate  12.5 mg Oral BID  . multivitamin with minerals  1 tablet Oral Daily  . pantoprazole  40 mg Oral Daily  . pneumococcal 23 valent vaccine  0.5 mL Intramuscular Tomorrow-1000  . potassium chloride SA  20 mEq Oral Daily  . sodium chloride  3 mL Intravenous Q12H  . thiamine  100 mg Oral Daily   Or  . thiamine  100 mg Intravenous Daily  . ticagrelor  90 mg Oral BID    Infusions:    PRN Medications: sodium chloride, acetaminophen, LORazepam **OR** LORazepam, nitroGLYCERIN, ondansetron (ZOFRAN) IV, sodium chloride   Assessment and Plan:   Acute inferior MI s/p angioplasty  LHC revealed occlusion of terminal portion of PLA treated with angioplasty which was partically successful.  No stenting.  Denies CP this AM and is ready to go home.  -cont ASA/brilinta x 1 month -cont atorvastatin 80mg   -cont metoprolol 12.5mg  bid  HTN -cont norvasic  -cont atorvastatin    Hyperlipidemia -cont atorvastatin   Alcohol use -cont thiamine, folate    Noah Salvage, MD PGY-3, Internal Medicine Teaching Service 02/09/2015, 11:33 AM   Attending Note:   The patient was seen and examined.  Agree with assessment and plan as noted above.  Changes made to the above note as needed.  Has done well. No angina Has ambulated without problems    Vesta Mixer, Montez Hageman., MD, [facility] 02/09/2015, 12:00 PM [address]  [address] Office [phone] Pager [phone]

## 2015-02-09 NOTE — Progress Notes (Signed)
Spoke w pt again. Went over need to take brilinta. He has 30day card free and his [facility] has brilinta in stock. He was given signed for for pt assist form to place proof of income and mail in to get brilinta thru drug company.

## 2015-02-09 NOTE — Discharge Summary (Signed)
Name: BRYCE GILLMORE MRN: [id number] DOB: 1951-01-04 64 y.o. PCP: Lorin Picket. Fabienne Bruns, MD ______________________________________________________________  Date of Admission: 02/07/2015  4:29 PM Date of Discharge: 02/09/2015 Attending Physician: Corky Crafts, MD   Discharge Diagnosis: Patient Active Problem List   Diagnosis Date Noted  . Essential hypertension 02/09/2015  . Hyperlipidemia 02/09/2015  . Smoking 02/09/2015  . Alcohol use 02/09/2015  . Acute inferior myocardial infarction 02/07/2015     Discharge Medications:   Medication List    TAKE these medications        amLODipine 5 MG tablet  Commonly known as:  NORVASC  Take 1 tablet (5 mg total) by mouth daily.     aspirin 81 MG tablet  Take 81 mg by mouth daily.     Fish Oil 1000 MG Caps  Take 1,000 mg by mouth daily.     folic acid 1 MG tablet  Commonly known as:  FOLVITE  Take 1 tablet (1 mg total) by mouth daily.     metoprolol tartrate 25 MG tablet  Commonly known as:  LOPRESSOR  Take 0.5 tablets (12.5 mg total) by mouth 2 (two) times daily.     multivitamin with minerals Tabs tablet  Take 1 tablet by mouth daily.     nitroGLYCERIN 0.4 MG SL tablet  Commonly known as:  NITROSTAT  Place 0.4 mg under the tongue every 5 (five) minutes as needed for chest pain.     omeprazole 20 MG capsule  Commonly known as:  PRILOSEC  Take 20 mg by mouth daily.     potassium chloride SA 20 MEQ tablet  Commonly known as:  K-DUR,KLOR-CON  Take 20 mEq by mouth daily.     pravastatin 40 MG tablet  Commonly known as:  PRAVACHOL  Take 1 tablet (40 mg total) by mouth daily.     thiamine 100 MG tablet  Take 1 tablet (100 mg total) by mouth daily.     ticagrelor 90 MG Tabs tablet  Commonly known as:  BRILINTA  Take 1 tablet (90 mg total) by mouth 2 (two) times daily.     vitamin C 250 MG tablet  Commonly known as:  ASCORBIC ACID  Take 250 mg by mouth daily.        Disposition and follow-up:     Mr.Lionel C Gutter was discharged from [facility] in stable condition to home.  Please address the following problems post-discharge:  1.Compliance with medications including BP and cholesterol medications 2.Smoking cessation  3.Alcohol use 4.Follow up with cardiology     Labs / imaging needed at time of follow-up: None   Pending labs/ test needing follow-up: None  Follow-up Appointments: Follow-up Information    Follow up with Arletta Bale, MD. Call today.   Specialty:  Family Medicine   Why:  A hospital follow up appointment   Contact information:   [address] [place] [address]       Discharge Instructions: Discharge Instructions    Call MD for:  severe uncontrolled pain    Complete by:  As directed      Diet - low sodium heart healthy    Complete by:  As directed      Discharge instructions    Complete by:  As directed   Follow up with your primary care doctor within 1 week after you leave the hospital.     Increase activity slowly    Complete by:  As directed  Consultations:    Procedures Performed:  No results found.  2D Echo: N/A  Cardiac Cath 02/07/2015:   Mid RCA lesion, 20% in stent restenosis.  Prox LAD to Mid LAD lesion, 50% stenosed.  1st RPLB lesion, 100% stenosed which was the culprit for his presentation. He was treated with   balloon angioplasty and his symptoms improved. The flow improved somewhat but the occlusion  was not completely cleared. His pain improved significantly post procedure so we stopped the  procedure at that point. Since it was a small vessel and his symptoms improved, I felt that the  risk of further attempts at angioplasty was greater than any potential benefit.  The left ventricular systolic function is normal.  Admission HPI:  Jamesrobert Ohanesian is a 64 y.o. male with known CAD s/p 3 stent who presented to [facility] ED for evaluation of chest pain x 1 hour.   The patient was  presented to [facility] ED for evaluation of substernal chest pain x 1 hour, described as aching that radiated to his left arm. In ED he received 4 nitro and  ASA.The pain is constant and rates 8/10. He takes ASA everyday. He denies any SOB or palpitation.   His EKG showed ST elevated in inferior lead. Lytes are normal. POC trop normal. Cath 2015 showed mild nonobstructive disease, restenosis of RCA stent and EF of 45-505. Patient lives by him self. Wife died 5 years. Son is here but unable to provide detailed history, only think he know that patient smokes about 1-2 pack a day and alcohol drinking. He live nearby to him.   Original by: Manson Passey, [facility] Course by problem list: Principal Problem:   Acute inferior myocardial infarction Active Problems:   Essential hypertension   Hyperlipidemia   Smoking   Alcohol use   Acute inferior MI s/p angioplasty  Patient presented to [facility] with chest pain.  EKG revealed STEMI in the inferior leads.  He was taken to cath lab with LHC revealing occlusion of terminal portion of PLA treated with angioplasty which was partically successful (see cath report above).  No stenting to the coronary arteries was done.  Lipid panel showed LDL of 113 with HDL of 37, otherwise normal.  HA1c was normal.  Other labs normal.  He was discharged without further episodes of chest pain on ASA, brilinta, metoprolol, and pravastatin.  Patient also reports taking fish oil at home as does not wish to take a statin because it makes him feel bad.  He also is concerned regarding the cost since he is without health insurance.  Pravastatin  was chosen due to cost issues.  He should be on brilinta for at least 1 month and was given a card to obtain a 30 day supply at no cost.     HTN SBP initially in 150s and was started on norvasc  to be titrated up as needed.    Hyperlipidemia Plans per above.    Alcohol use He was placed on CIWA without  complications.  Also given thiamine, folate, and a MVI.    Tobacco abuse Patient was counseled on smoking cessation but seems resistant to quit.  He was given information on the quit line.  Please follow up with PCP.    Discharge Vitals:   BP 142/75 mmHg  Pulse 54  Temp(Src) 97.8 F (36.6 C) (Oral)  Resp 18  Ht  (1.803 m)  Wt 99.791 kg (220 lb)  BMI 30.70 kg/m2  SpO2 100%  Discharge Labs:  No results found for this or any previous visit (from the past 24 hour(s)).  Signed: Marrian Salvage, MD 02/09/2015, 1:18 PM   Services Ordered on Discharge: None Equipment Ordered on Discharge: None  Attending Note:   The patient was seen and examined.  Agree with assessment and plan as noted above.  Changes made to the above note as needed.  Pt has done well. Stable for Dc See my note from earlier today    Alvia Grove., MD, [facility] 02/09/2015, 6:18 PM [address],  [address] Office [phone] Pager [phone]

## 2015-05-19 DIAGNOSIS — I2510 Atherosclerotic heart disease of native coronary artery without angina pectoris: Secondary | ICD-10-CM

## 2015-05-19 DIAGNOSIS — Z9861 Coronary angioplasty status: Secondary | ICD-10-CM

## 2015-05-19 HISTORY — DX: Atherosclerotic heart disease of native coronary artery without angina pectoris: I25.10

## 2015-05-19 HISTORY — DX: Coronary angioplasty status: Z98.61

## 2017-05-17 ENCOUNTER — Emergency Department (HOSPITAL_COMMUNITY): Payer: Medicare Other

## 2017-05-17 ENCOUNTER — Encounter (HOSPITAL_COMMUNITY): Payer: Self-pay

## 2017-05-17 ENCOUNTER — Inpatient Hospital Stay (HOSPITAL_COMMUNITY)
Admission: EM | Admit: 2017-05-17 | Discharge: 2017-05-27 | DRG: 234 | Disposition: A | Discharge status: Home / Self Care | Payer: Medicare Other | Attending: Cardiothoracic Surgery | Admitting: Cardiothoracic Surgery

## 2017-05-17 DIAGNOSIS — J9811 Atelectasis: Secondary | ICD-10-CM | POA: Diagnosis not present

## 2017-05-17 DIAGNOSIS — Z09 Encounter for follow-up examination after completed treatment for conditions other than malignant neoplasm: Secondary | ICD-10-CM

## 2017-05-17 DIAGNOSIS — F17210 Nicotine dependence, cigarettes, uncomplicated: Secondary | ICD-10-CM | POA: Diagnosis present

## 2017-05-17 DIAGNOSIS — Z7951 Long term (current) use of inhaled steroids: Secondary | ICD-10-CM

## 2017-05-17 DIAGNOSIS — F1090 Alcohol use, unspecified, uncomplicated: Secondary | ICD-10-CM | POA: Diagnosis present

## 2017-05-17 DIAGNOSIS — R079 Chest pain, unspecified: Secondary | ICD-10-CM | POA: Diagnosis not present

## 2017-05-17 DIAGNOSIS — T82855A Stenosis of coronary artery stent, initial encounter: Secondary | ICD-10-CM | POA: Diagnosis present

## 2017-05-17 DIAGNOSIS — Y831 Surgical operation with implant of artificial internal device as the cause of abnormal reaction of the patient, or of later complication, without mention of misadventure at the time of the procedure: Secondary | ICD-10-CM | POA: Diagnosis present

## 2017-05-17 DIAGNOSIS — Z7289 Other problems related to lifestyle: Secondary | ICD-10-CM | POA: Diagnosis present

## 2017-05-17 DIAGNOSIS — Z7982 Long term (current) use of aspirin: Secondary | ICD-10-CM

## 2017-05-17 DIAGNOSIS — F17200 Nicotine dependence, unspecified, uncomplicated: Secondary | ICD-10-CM | POA: Diagnosis present

## 2017-05-17 DIAGNOSIS — I200 Unstable angina: Secondary | ICD-10-CM | POA: Diagnosis present

## 2017-05-17 DIAGNOSIS — Z951 Presence of aortocoronary bypass graft: Secondary | ICD-10-CM

## 2017-05-17 DIAGNOSIS — I252 Old myocardial infarction: Secondary | ICD-10-CM

## 2017-05-17 DIAGNOSIS — Z789 Other specified health status: Secondary | ICD-10-CM | POA: Diagnosis present

## 2017-05-17 DIAGNOSIS — R001 Bradycardia, unspecified: Secondary | ICD-10-CM | POA: Diagnosis present

## 2017-05-17 DIAGNOSIS — I25110 Atherosclerotic heart disease of native coronary artery with unstable angina pectoris: Secondary | ICD-10-CM | POA: Diagnosis not present

## 2017-05-17 DIAGNOSIS — Z9861 Coronary angioplasty status: Secondary | ICD-10-CM

## 2017-05-17 DIAGNOSIS — E785 Hyperlipidemia, unspecified: Secondary | ICD-10-CM | POA: Diagnosis present

## 2017-05-17 DIAGNOSIS — IMO0001 Reserved for inherently not codable concepts without codable children: Secondary | ICD-10-CM | POA: Diagnosis present

## 2017-05-17 DIAGNOSIS — J449 Chronic obstructive pulmonary disease, unspecified: Secondary | ICD-10-CM | POA: Diagnosis present

## 2017-05-17 DIAGNOSIS — F1010 Alcohol abuse, uncomplicated: Secondary | ICD-10-CM | POA: Diagnosis present

## 2017-05-17 DIAGNOSIS — D62 Acute posthemorrhagic anemia: Secondary | ICD-10-CM | POA: Diagnosis not present

## 2017-05-17 DIAGNOSIS — G4700 Insomnia, unspecified: Secondary | ICD-10-CM | POA: Diagnosis not present

## 2017-05-17 DIAGNOSIS — I2510 Atherosclerotic heart disease of native coronary artery without angina pectoris: Secondary | ICD-10-CM

## 2017-05-17 DIAGNOSIS — I10 Essential (primary) hypertension: Secondary | ICD-10-CM | POA: Diagnosis present

## 2017-05-17 DIAGNOSIS — K219 Gastro-esophageal reflux disease without esophagitis: Secondary | ICD-10-CM | POA: Diagnosis present

## 2017-05-17 LAB — BASIC METABOLIC PANEL
ANION GAP: 5 (ref 5–15)
BUN: 12 mg/dL (ref 6–20)
CO2: 22 mmol/L (ref 22–32)
Calcium: 8.3 mg/dL — ABNORMAL LOW (ref 8.9–10.3)
Chloride: 107 mmol/L (ref 101–111)
Creatinine, Ser: 0.97 mg/dL (ref 0.61–1.24)
GFR calc Af Amer: >60 mL/min (ref >60)
GFR calc non Af Amer: >60 mL/min (ref >60)
GLUCOSE: 98 mg/dL (ref 65–99)
POTASSIUM: 4 mmol/L (ref 3.5–5.1)
Sodium: 134 mmol/L — ABNORMAL LOW (ref 135–145)

## 2017-05-17 LAB — I-STAT TROPONIN, ED: Troponin i, poc: 0.01 ng/mL (ref 0.00–0.08)

## 2017-05-17 LAB — CBC
HEMATOCRIT: 41.7 % (ref 39.0–52.0)
Hemoglobin: 15 g/dL (ref 13.0–17.0)
MCH: 35 pg — ABNORMAL HIGH (ref 26.0–34.0)
MCHC: 36 g/dL (ref 30.0–36.0)
MCV: 97.4 fL (ref 78.0–100.0)
Platelets: 176 10*3/uL (ref 150–400)
RBC: 4.28 MIL/uL (ref 4.22–5.81)
RDW: 12.7 % (ref 11.5–15.5)
WBC: 6.9 10*3/uL (ref 4.0–10.5)

## 2017-05-17 MED ORDER — LORAZEPAM 2 MG/ML IJ SOLN: 0.0000 mg | Freq: Four times a day (QID) | INTRAMUSCULAR | Status: AC

## 2017-05-17 MED ORDER — VITAMIN B-1 100 MG PO TABS
100.0000 mg | ORAL_TABLET | Freq: Every day | ORAL | Status: DC
  Filled 2017-05-17: qty 1

## 2017-05-17 MED ORDER — ADULT MULTIVITAMIN W/MINERALS CH
1.0000 | ORAL_TABLET | Freq: Every day | ORAL | Status: DC
  Administered 2017-05-19 – 2017-05-21 (×3): 1 via ORAL
  Filled 2017-05-17 (×4): qty 1

## 2017-05-17 MED ORDER — ASPIRIN 81 MG PO CHEW: 324.0000 mg | CHEWABLE_TABLET | Freq: Once | ORAL | Status: DC

## 2017-05-17 MED ORDER — THIAMINE HCL 100 MG/ML IJ SOLN
100.0000 mg | Freq: Every day | INTRAMUSCULAR | Status: DC
  Filled 2017-05-17: qty 2

## 2017-05-17 MED ORDER — ACETAMINOPHEN 325 MG PO TABS
650.0000 mg | ORAL_TABLET | ORAL | Status: DC | PRN
  Administered 2017-05-18: 650 mg via ORAL
  Filled 2017-05-17 (×2): qty 2

## 2017-05-17 MED ORDER — NITROGLYCERIN 0.4 MG SL SUBL: 0.4000 mg | SUBLINGUAL_TABLET | SUBLINGUAL | Status: DC | PRN

## 2017-05-17 MED ORDER — LORAZEPAM 2 MG/ML IJ SOLN: 0.0000 mg | Freq: Two times a day (BID) | INTRAMUSCULAR | Status: AC

## 2017-05-17 MED ORDER — LORAZEPAM 1 MG PO TABS: 1.0000 mg | ORAL_TABLET | Freq: Four times a day (QID) | ORAL | Status: AC | PRN

## 2017-05-17 MED ORDER — FOLIC ACID 1 MG PO TABS
1.0000 mg | ORAL_TABLET | Freq: Every day | ORAL | Status: DC
  Administered 2017-05-19 – 2017-05-21 (×3): 1 mg via ORAL
  Filled 2017-05-17 (×4): qty 1

## 2017-05-17 MED ORDER — ASPIRIN EC 325 MG PO TBEC
325.0000 mg | DELAYED_RELEASE_TABLET | Freq: Every day | ORAL | Status: DC
  Administered 2017-05-18 – 2017-05-22 (×5): 325 mg via ORAL
  Filled 2017-05-17 (×5): qty 1

## 2017-05-17 MED ORDER — HEPARIN BOLUS VIA INFUSION
4000.0000 [IU] | Freq: Once | INTRAVENOUS | Status: AC
  Administered 2017-05-17: 4000 [IU] via INTRAVENOUS
  Filled 2017-05-17: qty 4000

## 2017-05-17 MED ORDER — HEPARIN (PORCINE) IN NACL 100-0.45 UNIT/ML-% IJ SOLN
1700.0000 [IU]/h | INTRAMUSCULAR | Status: DC
  Administered 2017-05-17: 1400 [IU]/h via INTRAVENOUS
  Administered 2017-05-18: 1700 [IU]/h via INTRAVENOUS
  Filled 2017-05-17 (×2): qty 250

## 2017-05-17 MED ORDER — NITROGLYCERIN 2 % TD OINT
1.0000 [in_us] | TOPICAL_OINTMENT | Freq: Once | TRANSDERMAL | Status: DC
  Filled 2017-05-17: qty 30

## 2017-05-17 MED ORDER — ACETAMINOPHEN 500 MG PO TABS
1000.0000 mg | ORAL_TABLET | Freq: Once | ORAL | Status: AC
  Administered 2017-05-17: 1000 mg via ORAL
  Filled 2017-05-17: qty 2

## 2017-05-17 MED ORDER — ENOXAPARIN SODIUM 40 MG/0.4ML ~~LOC~~ SOLN: 40.0000 mg | SUBCUTANEOUS | Status: DC

## 2017-05-17 MED ORDER — LORAZEPAM 2 MG/ML IJ SOLN: 1.0000 mg | Freq: Four times a day (QID) | INTRAMUSCULAR | Status: AC | PRN

## 2017-05-17 MED ORDER — NITROGLYCERIN 2 % TD OINT
1.0000 [in_us] | TOPICAL_OINTMENT | Freq: Once | TRANSDERMAL | Status: AC
  Administered 2017-05-17: 1 [in_us] via TOPICAL
  Filled 2017-05-17: qty 1

## 2017-05-17 MED ORDER — ONDANSETRON HCL 4 MG/2ML IJ SOLN: 4.0000 mg | Freq: Four times a day (QID) | INTRAMUSCULAR | Status: DC | PRN

## 2017-05-17 MED ORDER — OMEGA-3-ACID ETHYL ESTERS 1 G PO CAPS
1000.0000 mg | ORAL_CAPSULE | Freq: Every day | ORAL | Status: DC
  Filled 2017-05-17: qty 1

## 2017-05-17 NOTE — ED Notes (Signed)
Blood in Mini lab

## 2017-05-17 NOTE — ED Triage Notes (Signed)
Pt arrived via [facility] from home c/o left sided chest pain.  EMS gave 324 ASA, 1 SL Nitro.  Pain 2/10.

## 2017-05-17 NOTE — Consult Note (Signed)
CARDIOLOGY CONSULT NOTE   Referring Physician: Dr. Adela Lank Primary Physician: Dr. Fabienne Bruns Primary Cardiologist: Dr. Karin Golden Reason for Consultation: chest pain   HPI: Noah Walker is a 66 yo man with PMH CAD s/p prior PCI who presents with chest pain. He is seen in consult at the request of Dr. Adela Lank for assistance with management. The patients reports that he was in his usual state of health until evening of 10/21. He had been at home most of the day, but when he went to get into his truck he felt a rapid onset heavy/dull discomfort across his chest that radiated to his left shoulder. This was similar to how he felt during his previous MI. He also felt short of breath and diaphoretic at the time. His symptoms were resolved with NG, and EMS brought him to the ER for further evaluation.   He does endorse significant alcohol use as well as continued tobacco use. He does not typically get angina. He otherwise has been in his usual state of health. He is currently comfortable. Initial troponin negative.  Review of Systems:     Cardiac Review of Systems: {Y] = yes [ ]  = no  Chest Pain [  Y  ]  Resting SOB [ N  ] Exertional SOB  [ N ]  Orthopnea Klaus.Mock  ]   Pedal Edema [  N ]    Palpitations Klaus.Mock  ] Syncope  [  N]   Presyncope [ N  ]  General Review of Systems: [Y] = yes [  ]=no Constitional: recent weight change [  ]; anorexia [  ]; fatigue [  ]; nausea [  ]; night sweats [  ]; fever [  ]; or chills [  ];                                                                     Eyes : blurred vision [  ]; diplopia [   ]; vision changes [  ];  Amaurosis fugax[  ]; Resp: cough [  ];  wheezing[  ];  hemoptysis[  ];  PND [  ];  GI:  gallstones[  ], vomiting[  ];  dysphagia[  ]; melena[  ];  hematochezia [  ]; heartburn[  ];   GU: kidney stones [  ]; hematuria[  ];   dysuria [  ];  nocturia[  ]; incontinence [  ];             Skin: rash, swelling[  ];, hair loss[  ];  peripheral edema[  ];  or itching[   ]; Musculosketetal: myalgias[  ];  joint swelling[  ];  joint erythema[  ];  joint pain[  ];  back pain[  ];  Heme/Lymph: bruising[  ];  bleeding[  ];  anemia[  ];  Neuro: TIA[  ];  headaches[  ];  stroke[  ];  vertigo[  ];  seizures[  ];   paresthesias[  ];  difficulty walking[  ];  Psych:depression[  ]; anxiety[  ];  Endocrine: diabetes[  ];  thyroid dysfunction[  ];  Other:  Past Medical History:  Diagnosis Date  . MI (myocardial infarction) (HCC)   . Reflux     Medications Prior to  Admission  Medication Sig Dispense Refill  . aspirin 81 MG tablet Take 81 mg by mouth daily.    . nitroGLYCERIN (NITROSTAT) 0.4 MG SL tablet Place 0.4 mg under the tongue every 5 (five) minutes as needed for chest pain.    . Omega-3 Fatty Acids (FISH OIL) 1000 MG CAPS Take 1,000 mg by mouth daily.    . vitamin C (ASCORBIC ACID) 250 MG tablet Take 250 mg by mouth daily.    Marland Kitchen amLODipine (NORVASC) 5 MG tablet Take 1 tablet (5 mg total) by mouth daily. (Patient not taking: Reported on 05/17/2017) 30 tablet 1  . folic acid (FOLVITE) 1 MG tablet Take 1 tablet (1 mg total) by mouth daily. (Patient not taking: Reported on 05/17/2017)    . metoprolol tartrate (LOPRESSOR) 25 MG tablet Take 0.5 tablets (12.5 mg total) by mouth 2 (two) times daily. (Patient not taking: Reported on 05/17/2017) 60 tablet 1  . Multiple Vitamin (MULTIVITAMIN WITH MINERALS) TABS tablet Take 1 tablet by mouth daily. (Patient not taking: Reported on 05/17/2017)    . potassium chloride SA (K-DUR,KLOR-CON) 20 MEQ tablet Take 20 mEq by mouth daily.    . pravastatin (PRAVACHOL) 40 MG tablet Take 1 tablet (40 mg total) by mouth daily. (Patient not taking: Reported on 05/17/2017) 30 tablet 1  . thiamine 100 MG tablet Take 1 tablet (100 mg total) by mouth daily. (Patient not taking: Reported on 05/17/2017)    . ticagrelor (BRILINTA) 90 MG TABS tablet Take 1 tablet (90 mg total) by mouth 2 (two) times daily. (Patient not taking: Reported on  05/17/2017) 60 tablet 1       Infusions: . heparin 1,400 Units/hr (05/17/17 2202)    No Known Allergies  Social History   Social History  . Marital status: Unknown    Spouse name: N/A  . Number of children: N/A  . Years of education: N/A   Occupational History  . Not on file.   Social History Main Topics  . Smoking status: Current Every Day Smoker    Packs/day: 1.00    Types: Cigarettes  . Smokeless tobacco: Never Used  . Alcohol use Yes  . Drug use: No  . Sexual activity: Not on file   Other Topics Concern  . Not on file   Social History Narrative  . No narrative on file    History reviewed. No pertinent family history.  PHYSICAL EXAM: Vitals:   05/17/17 2130 05/17/17 2200  BP: 138/77 (!) 148/97  Pulse: (!) 57 (!) 57  Resp: (!) 21 (!) 21  Temp:    SpO2: 97% 96%    No intake or output data in the 24 hours ending 05/17/17 2237  General:  Well appearing. No respiratory difficulty HEENT: normal Neck: supple. no JVD. Carotids 2+ bilat. No lymphadenopathy or thryomegaly appreciated. Cor: PMI nondisplaced. Regular rate & rhythm. No rubs, gallops or murmurs. Lungs: clear Abdomen: soft, nontender, nondistended. No hepatosplenomegaly. No bruits or masses. Good bowel sounds. Extremities: no cyanosis, clubbing, rash, edema Neuro: alert & oriented x 3, cranial nerves grossly intact. moves all 4 extremities w/o difficulty. Affect pleasant.  ECG: sinus rhythm, t waves similar to prior  Results for orders placed or performed during the hospital encounter of 05/17/17 (from the past 24 hour(s))  Basic metabolic panel     Status: Abnormal   Collection Time: 05/17/17  7:22 PM  Result Value Ref Range   Sodium 134 (L) 135 - 145 mmol/L   Potassium 4.0 3.5 -  5.1 mmol/L   Chloride 107 101 - 111 mmol/L   CO2 22 22 - 32 mmol/L   Glucose, Bld 98 65 - 99 mg/dL   BUN 12 6 - 20 mg/dL   Creatinine, Ser 1.610.97 0.61 - 1.24 mg/dL   Calcium 8.3 (L) 8.9 - 10.3 mg/dL   GFR calc  non Af Amer >60 >60 mL/min   GFR calc Af Amer >60 >60 mL/min   Anion gap 5 5 - 15  CBC     Status: Abnormal   Collection Time: 05/17/17  7:22 PM  Result Value Ref Range   WBC 6.9 4.0 - 10.5 K/uL   RBC 4.28 4.22 - 5.81 MIL/uL   Hemoglobin 15.0 13.0 - 17.0 g/dL   HCT 09.641.7 04.539.0 - 40.952.0 %   MCV 97.4 78.0 - 100.0 fL   MCH 35.0 (H) 26.0 - 34.0 pg   MCHC 36.0 30.0 - 36.0 g/dL   RDW 81.112.7 91.411.5 - 78.215.5 %   Platelets 176 150 - 400 K/uL  I-stat troponin, ED ([facility])     Status: None   Collection Time: 05/17/17  7:44 PM  Result Value Ref Range   Troponin i, poc 0.01 0.00 - 0.08 ng/mL   Comment 3           Dg Chest 2 View  Result Date: 05/17/2017 CLINICAL DATA:  Initial evaluation for acute left-sided chest pain. EXAM: CHEST  2 VIEW COMPARISON:  Prior radiograph 05/30/2012. FINDINGS: The cardiac and mediastinal silhouettes are stable in size and contour, and remain within normal limits. The lungs are normally inflated. No airspace consolidation, pleural effusion, or pulmonary edema is identified. There is no pneumothorax. No acute osseous abnormality identified. IMPRESSION: No active cardiopulmonary disease. Electronically Signed   By: Rise MuBenjamin  McClintock M.D.   On: 05/17/2017 21:03   ASSESSMENT/RECOMMENDATIONS: Mr. Noah Walker's history of CAD, symptoms like his prior MI, and classic nature of his symptoms are concerning for unstable angina. Initial troponin is negative, but would suspect additional values will be positive. Would treat for ACS while waiting for follow up troponin values. His significant alcohol use puts him at risk for withdrawal if he remains in the hospital for ~48 hours, will need to monitor. Appears comfortable, not SOB, PE unlikely. Pulses equal, BP normal, low suspicion for dissection. Last cath in 2016  Suspected unstable angina/possible early ACS. -start heparin drip -received 324 mg aspirin already, continue 81 mg daily -per EMR was on ticagrelor prior to admission, unclear  if he was actually taking, would continue if so (noted intolerance to clopidogrel in the past) -was on pravastatin at home, would change to atorvastatin 80 mg daily given possible ACS -NPO at midnight for possible cath in AM -trend troponins -tobacco cessation counseling -consider echo depending on plans for workup -if HR allows, could start low dose beta blocker. Heart rates have been 50-70 bpm  Would monitor for alcohol withdrawal in 48-72 hr window given heavy use  Jodelle RedBridgette Kylo Gavin, MD, PhD, overnight cardiology provider

## 2017-05-17 NOTE — H&P (Signed)
History and Physical    Noah Walker ZOX:[id number] DOB: [birth year] DOA: 05/17/2017  PCP: Titus DubinFutrell, Thomas M., MD  Patient coming from: Home.  Chief Complaint: Chest pain.  HPI: Noah Walker is a 66 y.o. male with history of CAD status post stenting in 2016 and ongoing tobacco abuse presents to the ER with complaints of chest pain.  This afternoon patient while walking started developing chest pressure retrosternal radiating to the left arm with diaphoresis.  The pain lasted for 10 minutes and was completely resolved after patient took sublingual nitroglycerin.  Denies any shortness of breath productive cough fever or chills.  ED Course: In the ER patient was chest pain-free.  Given the symptoms patient was started on heparin infusion.  EKG was showing nonspecific findings.  Chest x-ray was unremarkable.  Troponin was negative.  Cardiology was consulted.  Patient is being admitted for possible unstable angina.  Patient states he does drink alcohol every day.  Review of Systems: As per HPI, rest all negative.   Past Medical History:  Diagnosis Date  . MI (myocardial infarction) (HCC)   . Reflux     Past Surgical History:  Procedure Laterality Date  . CARDIAC CATHETERIZATION    . CARDIAC CATHETERIZATION N/A 02/07/2015   Procedure: Left Heart Cath and Coronary Angiography;  Surgeon: Corky CraftsJayadeep S Varanasi, MD;  [facility];  Service: Cardiovascular;  Laterality: N/A;  . CARDIAC CATHETERIZATION  02/07/2015   Procedure: Coronary Balloon Angioplasty;  Surgeon: Corky CraftsJayadeep S Varanasi, MD;  [facility];  Service: Cardiovascular;;  . CORONARY ANGIOPLASTY       reports that he has been smoking Cigarettes.  He has been smoking about 1.00 pack per day. He has never used smokeless tobacco. He reports that he drinks alcohol. He reports that he does not use drugs.  No Known Allergies  Family History  Problem Relation Age of Onset  . CAD Neg Hx   . Stroke Neg Hx      Prior to Admission medications   Medication Sig Start Date End Date Taking? Authorizing Provider  aspirin 81 MG tablet Take 81 mg by mouth daily.   Yes [provider]  nitroGLYCERIN (NITROSTAT) 0.4 MG SL tablet Place 0.4 mg under the tongue every 5 (five) minutes as needed for chest pain.   Yes [provider]  Omega-3 Fatty Acids (FISH OIL) 1000 MG CAPS Take 1,000 mg by mouth daily.   Yes [provider]  vitamin C (ASCORBIC ACID) 250 MG tablet Take 250 mg by mouth daily.   Yes [provider]  amLODipine (NORVASC) 5 MG tablet Take 1 tablet (5 mg total) by mouth daily. Patient not taking: Reported on 05/17/2017 02/09/15   Marrian SalvageGill, Jacquelyn S, MD  folic acid (FOLVITE) 1 MG tablet Take 1 tablet (1 mg total) by mouth daily. Patient not taking: Reported on 05/17/2017 02/09/15   Marrian SalvageGill, Jacquelyn S, MD  metoprolol tartrate (LOPRESSOR) 25 MG tablet Take 0.5 tablets (12.5 mg total) by mouth 2 (two) times daily. Patient not taking: Reported on 05/17/2017 02/09/15   Marrian SalvageGill, Jacquelyn S, MD  Multiple Vitamin (MULTIVITAMIN WITH MINERALS) TABS tablet Take 1 tablet by mouth daily. Patient not taking: Reported on 05/17/2017 02/09/15   Marrian SalvageGill, Jacquelyn S, MD  potassium chloride SA (K-DUR,KLOR-CON) 20 MEQ tablet Take 20 mEq by mouth daily.    [provider]  pravastatin (PRAVACHOL) 40 MG tablet Take 1 tablet (40 mg total) by mouth daily. Patient not  taking: Reported on 05/17/2017 02/09/15   Marrian Salvage, MD  thiamine 100 MG tablet Take 1 tablet (100 mg total) by mouth daily. Patient not taking: Reported on 05/17/2017 02/09/15   Marrian Salvage, MD  ticagrelor (BRILINTA) 90 MG TABS tablet Take 1 tablet (90 mg total) by mouth 2 (two) times daily. Patient not taking: Reported on 05/17/2017 02/09/15   Marrian Salvage, MD    Physical Exam: Vitals:   05/17/17 2130 05/17/17 2200 05/17/17 2230 05/17/17 2257  BP: 138/77 (!) 148/97 (!) 142/89 135/85  Pulse: (!) 57  (!) 57 72 64  Resp: (!) 21 (!) 21 15 18   Temp:    97.6 F (36.4 C)  TempSrc:    Oral  SpO2: 97% 96% 96% 95%  Weight:    95.8 kg (211 lb 1.6 oz)  Height:    6' (1.829 m)      Constitutional: Moderately built and nourished. Vitals:   05/17/17 2130 05/17/17 2200 05/17/17 2230 05/17/17 2257  BP: 138/77 (!) 148/97 (!) 142/89 135/85  Pulse: (!) 57 (!) 57 72 64  Resp: (!) 21 (!) 21 15 18   Temp:    97.6 F (36.4 C)  TempSrc:    Oral  SpO2: 97% 96% 96% 95%  Weight:    95.8 kg (211 lb 1.6 oz)  Height:    6' (1.829 m)   Eyes: Anicteric no pallor. ENMT: No discharge from the ears eyes nose or mouth. Neck: No mass felt.  No neck rigidity. Respiratory: No rhonchi or crepitations. Cardiovascular: S1-S2 heard no murmurs appreciated. Abdomen: Soft nontender bowel sounds present. Musculoskeletal: No edema.  No joint effusion. Skin: No rash.  Skin appears warm. Neurologic: Alert awake oriented to time place and person.  Moves all extremities. Psychiatric: Appears normal.  Normal affect.   Labs on Admission: I have personally reviewed following labs and imaging studies  CBC:  Recent Labs Lab 05/17/17 1922  WBC 6.9  HGB 15.0  HCT 41.7  MCV 97.4  PLT 176   Basic Metabolic Panel:  Recent Labs Lab 05/17/17 1922  NA 134*  K 4.0  CL 107  CO2 22  GLUCOSE 98  BUN 12  CREATININE 0.97  CALCIUM 8.3*   GFR: Estimated Creatinine Clearance: 91.2 mL/min (by C-G formula based on SCr of 0.97 mg/dL). Liver Function Tests: No results for input(s): AST, ALT, ALKPHOS, BILITOT, PROT, ALBUMIN in the last 168 hours. No results for input(s): LIPASE, AMYLASE in the last 168 hours. No results for input(s): AMMONIA in the last 168 hours. Coagulation Profile: No results for input(s): INR, PROTIME in the last 168 hours. Cardiac Enzymes: No results for input(s): CKTOTAL, CKMB, CKMBINDEX, TROPONINI in the last 168 hours. BNP (last 3 results) No results for input(s): PROBNP in the last 8760  hours. HbA1C: No results for input(s): HGBA1C in the last 72 hours. CBG: No results for input(s): GLUCAP in the last 168 hours. Lipid Profile: No results for input(s): CHOL, HDL, LDLCALC, TRIG, CHOLHDL, LDLDIRECT in the last 72 hours. Thyroid Function Tests: No results for input(s): TSH, T4TOTAL, FREET4, T3FREE, THYROIDAB in the last 72 hours. Anemia Panel: No results for input(s): VITAMINB12, FOLATE, FERRITIN, TIBC, IRON, RETICCTPCT in the last 72 hours. Urine analysis: No results found for: COLORURINE, APPEARANCEUR, LABSPEC, PHURINE, GLUCOSEU, HGBUR, BILIRUBINUR, KETONESUR, PROTEINUR, UROBILINOGEN, NITRITE, LEUKOCYTESUR Sepsis Labs: @LABRCNTIP (procalcitonin:4,lacticidven:4) )No results found for this or any previous visit (from the past 240 hour(s)).   Radiological Exams on Admission: Dg Chest 2 View  Result Date: 05/17/2017 CLINICAL DATA:  Initial evaluation for acute left-sided chest pain. EXAM: CHEST  2 VIEW COMPARISON:  Prior radiograph 05/30/2012. FINDINGS: The cardiac and mediastinal silhouettes are stable in size and contour, and remain within normal limits. The lungs are normally inflated. No airspace consolidation, pleural effusion, or pulmonary edema is identified. There is no pneumothorax. No acute osseous abnormality identified. IMPRESSION: No active cardiopulmonary disease. Electronically Signed   By: Rise Mu M.D.   On: 05/17/2017 21:03    EKG: Independently reviewed.  Normal sinus rhythm.  Nonspecific T wave changes.  Assessment/Plan Principal Problem:   Chest pain Active Problems:   Smoking   Alcohol use    1. Chest pain concerning for unstable angina with history of CAD status post stenting -patient has been placed on heparin infusion, aspirin and nitroglycerin patch.  We will cycle cardiac markers check 2D echo.  Cardiology consult appreciated.  Patient will be kept n.p.o. in anticipation of procedure.  Patient's heart rate is in the low normal so not  placed on beta-blocker.  Check urine drug screen. 2. Alcohol abuse -patient has been placed on CIWA protocol. 3. Tobacco abuse -tobacco cessation counseling requested.   DVT prophylaxis: Heparin. Code Status: Full code. Family Communication: Discussed with patient. Disposition Plan: Home. Consults called: Cardiology. Admission status: Observation.   Eduard Clos MD [facility] Pager [phone].  If 7PM-7AM, please contact night-coverage [email or web address] Password [email or web address]  05/17/2017, 11:51 PM

## 2017-05-17 NOTE — ED Provider Notes (Signed)
[facility] 3E CHF Provider Note   CSN: [id number] Arrival date & time: 05/17/17  1910     History   Chief Complaint Chief Complaint  Patient presents with  . Chest Pain    HPI Noah Walker is a 66 y.o. male.  66 yo M with a chief complaint of chest pain. Patient states this started when he was trying to get up into his truck. He felt a sudden pressure across his chest that radiated to his left shoulder associated with diaphoresis and shortness of breath. The patient took a nitroglycerin with significant improvement of his symptoms. This felt exactly the same as his prior STEMI 2 years ago. He does endorse continuing to smoke.   The history is provided by the patient.  Chest Pain   This is a new problem. The current episode started less than 1 hour ago. The problem occurs constantly. The problem has been resolved. The pain is associated with exertion. The pain is present in the substernal region. The pain is at a severity of 10/10. The pain is severe. The quality of the pain is described as brief. The pain radiates to the left shoulder. Duration of episode(s) is 1 hour. Associated symptoms include diaphoresis, nausea and shortness of breath. Pertinent negatives include no abdominal pain, no fever, no headaches, no palpitations and no vomiting. He has tried nitroglycerin for the symptoms. The treatment provided significant relief. Risk factors include smoking/tobacco exposure.  His past medical history is significant for MI.    Past Medical History:  Diagnosis Date  . MI (myocardial infarction) (HCC)   . Reflux     Patient Active Problem List   Diagnosis Date Noted  . Chest pain 05/17/2017  . Essential hypertension 02/09/2015  . Hyperlipidemia 02/09/2015  . Smoking 02/09/2015  . Alcohol use 02/09/2015  . Acute inferior myocardial infarction (HCC) 02/07/2015    Past Surgical History:  Procedure Laterality Date  . CARDIAC CATHETERIZATION    . CARDIAC  CATHETERIZATION N/A 02/07/2015   Procedure: Left Heart Cath and Coronary Angiography;  Surgeon: Corky Crafts, MD;  Location: [facility] INVASIVE CV LAB;  Service: Cardiovascular;  Laterality: N/A;  . CARDIAC CATHETERIZATION  02/07/2015   Procedure: Coronary Balloon Angioplasty;  Surgeon: Corky Crafts, MD;  Location: [facility] INVASIVE CV LAB;  Service: Cardiovascular;;  . CORONARY ANGIOPLASTY         Home Medications    Prior to Admission medications   Medication Sig Start Date End Date Taking? Authorizing Provider  aspirin 81 MG tablet Take 81 mg by mouth daily.   Yes [provider]  nitroGLYCERIN (NITROSTAT) 0.4 MG SL tablet Place 0.4 mg under the tongue every 5 (five) minutes as needed for chest pain.   Yes [provider]  Omega-3 Fatty Acids (FISH OIL) 1000 MG CAPS Take 1,000 mg by mouth daily.   Yes [provider]  vitamin C (ASCORBIC ACID) 250 MG tablet Take 250 mg by mouth daily.   Yes [provider]  amLODipine (NORVASC) 5 MG tablet Take 1 tablet (5 mg total) by mouth daily. Patient not taking: Reported on 05/17/2017 02/09/15   Marrian Salvage, MD  folic acid (FOLVITE) 1 MG tablet Take 1 tablet (1 mg total) by mouth daily. Patient not taking: Reported on 05/17/2017 02/09/15   Marrian Salvage, MD  metoprolol tartrate (LOPRESSOR) 25 MG tablet Take 0.5 tablets (12.5 mg total) by mouth 2 (two) times daily. Patient not taking: Reported on 05/17/2017  02/09/15   Marrian Salvage, MD  Multiple Vitamin (MULTIVITAMIN WITH MINERALS) TABS tablet Take 1 tablet by mouth daily. Patient not taking: Reported on 05/17/2017 02/09/15   Marrian Salvage, MD  potassium chloride SA (K-DUR,KLOR-CON) 20 MEQ tablet Take 20 mEq by mouth daily.    [provider]  pravastatin (PRAVACHOL) 40 MG tablet Take 1 tablet (40 mg total) by mouth daily. Patient not taking: Reported on 05/17/2017 02/09/15   Marrian Salvage, MD  thiamine 100 MG tablet Take 1 tablet (100  mg total) by mouth daily. Patient not taking: Reported on 05/17/2017 02/09/15   Marrian Salvage, MD  ticagrelor (BRILINTA) 90 MG TABS tablet Take 1 tablet (90 mg total) by mouth 2 (two) times daily. Patient not taking: Reported on 05/17/2017 02/09/15   Marrian Salvage, MD    Family History Family History  Problem Relation Age of Onset  . CAD Neg Hx   . Stroke Neg Hx     Social History Social History  Substance Use Topics  . Smoking status: Current Every Day Smoker    Packs/day: 1.00    Types: Cigarettes  . Smokeless tobacco: Never Used  . Alcohol use Yes     Allergies   Patient has no known allergies.   Review of Systems Review of Systems  Constitutional: Positive for diaphoresis. Negative for chills and fever.  HENT: Negative for congestion and facial swelling.   Eyes: Negative for discharge and visual disturbance.  Respiratory: Positive for shortness of breath.   Cardiovascular: Positive for chest pain. Negative for palpitations.  Gastrointestinal: Positive for nausea. Negative for abdominal pain, diarrhea and vomiting.  Musculoskeletal: Negative for arthralgias and myalgias.  Skin: Negative for color change and rash.  Neurological: Negative for tremors, syncope and headaches.  Psychiatric/Behavioral: Negative for confusion and dysphoric mood.     Physical Exam Updated Vital Signs BP 135/85 (BP Location: Left Arm)   Pulse 64   Temp 97.6 F (36.4 C) (Oral)   Resp 18   Ht 6' (1.829 m)   Wt 95.8 kg (211 lb 1.6 oz) Comment: scale a  SpO2 95%   BMI 28.63 kg/m   Physical Exam  Constitutional: He is oriented to person, place, and time. He appears well-developed and well-nourished.  HENT:  Head: Normocephalic and atraumatic.  Eyes: Pupils are equal, round, and reactive to light. EOM are normal.  Neck: Normal range of motion. Neck supple. No JVD present.  Cardiovascular: Normal rate and regular rhythm.  Exam reveals no gallop and no friction rub.   No murmur  heard. Pulmonary/Chest: No respiratory distress. He has no wheezes.  Abdominal: He exhibits no distension and no mass. There is no tenderness. There is no rebound and no guarding.  Musculoskeletal: Normal range of motion.  Neurological: He is alert and oriented to person, place, and time.  Skin: No rash noted. No pallor.  Psychiatric: He has a normal mood and affect. His behavior is normal.  Nursing note and vitals reviewed.    ED Treatments / Results  Labs (all labs ordered are listed, but only abnormal results are displayed) Labs Reviewed  BASIC METABOLIC PANEL - Abnormal; Notable for the following:       Result Value   Sodium 134 (*)    Calcium 8.3 (*)    All other components within normal limits  CBC - Abnormal; Notable for the following:    MCH 35.0 (*)    All other components within normal limits  HEPARIN  LEVEL (UNFRACTIONATED)  CBC  CBG MONITORING, ED  I-STAT TROPONIN, ED  I-STAT TROPONIN, ED    EKG  EKG Interpretation  Date/Time:  Sunday May 17 2017 19:14:39 EDT Ventricular Rate:  63 PR Interval:    QRS Duration: 112 QT Interval:  415 QTC Calculation: 425 R Axis:   46 Text Interpretation:  Sinus rhythm Borderline intraventricular conduction delay Abnormal T, consider ischemia, lateral leads No significant change since last tracing Confirmed by Melene Plan [phone]) on 05/17/2017 7:18:52 PM       Radiology Dg Chest 2 View  Result Date: 05/17/2017 CLINICAL DATA:  Initial evaluation for acute left-sided chest pain. EXAM: CHEST  2 VIEW COMPARISON:  Prior radiograph 05/30/2012. FINDINGS: The cardiac and mediastinal silhouettes are stable in size and contour, and remain within normal limits. The lungs are normally inflated. No airspace consolidation, pleural effusion, or pulmonary edema is identified. There is no pneumothorax. No acute osseous abnormality identified. IMPRESSION: No active cardiopulmonary disease. Electronically Signed   By: Rise Mu  M.D.   On: 05/17/2017 21:03    Procedures Procedures (including critical care time)  Medications Ordered in ED Medications  nitroGLYCERIN (NITROSTAT) SL tablet 0.4 mg (not administered)  heparin ADULT infusion 100 units/mL (25000 units/255mL sodium chloride 0.45%) (1,400 Units/hr Intravenous New Bag/Given 05/17/17 2202)  nitroGLYCERIN (NITROGLYN) 2 % ointment 1 inch (1 inch Topical Given 05/17/17 2051)  heparin bolus via infusion 4,000 Units (4,000 Units Intravenous Bolus from Bag 05/17/17 2203)  acetaminophen (TYLENOL) tablet 1,000 mg (1,000 mg Oral Given 05/17/17 2235)     Initial Impression / Assessment and Plan / ED Course  I have reviewed the triage vital signs and the nursing notes.  Pertinent labs & imaging results that were available during my care of the patient were reviewed by me and considered in my medical decision making (see chart for details).  Clinical Course as of May 17 2346  Wynelle Link May 17, 2017  2017 Trop negative. Discussed with Dr. Cristal Deer, cardiology who will come and evaluate the patient.   [DF]    Clinical Course User Index [DF] Melene Plan, DO    66 yo M with a chief complaint of chest pain. Story sounds very typical in a similar to his prior MI. Discussed with cardiology who come and evaluate the patient.  Cardiology feels that this is very typical chest pain and will likely require a cardiac catheterization in the morning. They do however feel that the patient would be better served on the hospitalist service. Started on heparin.     The patients results and plan were reviewed and discussed.   Any x-rays performed were independently reviewed by myself.   Differential diagnosis were considered with the presenting HPI.  Medications  nitroGLYCERIN (NITROSTAT) SL tablet 0.4 mg (not administered)  heparin ADULT infusion 100 units/mL (25000 units/255mL sodium chloride 0.45%) (1,400 Units/hr Intravenous New Bag/Given 05/17/17 2202)  nitroGLYCERIN  (NITROGLYN) 2 % ointment 1 inch (1 inch Topical Given 05/17/17 2051)  heparin bolus via infusion 4,000 Units (4,000 Units Intravenous Bolus from Bag 05/17/17 2203)  acetaminophen (TYLENOL) tablet 1,000 mg (1,000 mg Oral Given 05/17/17 2235)    Vitals:   05/17/17 2130 05/17/17 2200 05/17/17 2230 05/17/17 2257  BP: 138/77 (!) 148/97 (!) 142/89 135/85  Pulse: (!) 57 (!) 57 72 64  Resp: (!) 21 (!) 21 15 18   Temp:    97.6 F (36.4 C)  TempSrc:    Oral  SpO2: 97% 96% 96% 95%  Weight:  95.8 kg (211 lb 1.6 oz)  Height:    6' (1.829 m)    Final diagnoses:  Chest pain with high risk for cardiac etiology    Admission/ observation were discussed with the admitting physician, patient and/or family and they are comfortable with the plan.    Final Clinical Impressions(s) / ED Diagnoses   Final diagnoses:  Chest pain with high risk for cardiac etiology    New Prescriptions Current Discharge Medication List       Melene PlanFloyd, Ranell Skibinski, DO 05/17/17 2347

## 2017-05-17 NOTE — Progress Notes (Signed)
ANTICOAGULATION CONSULT NOTE - Initial Consult  Pharmacy Consult for Heparin Indication: chest pain/ACS  No Known Allergies  Patient Measurements: Height: 5' 11.5" (181.6 cm) Weight: 220 lb (99.8 kg) IBW/kg (Calculated) : 76.45  Vital Signs: Temp: 98.4 F (36.9 C) (10/21 1926) Temp Source: Oral (10/21 1926) BP: 141/80 (10/21 2100) Pulse Rate: 59 (10/21 2100)  Labs:  Recent Labs  05/17/17 1922  HGB 15.0  HCT 41.7  PLT 176  CREATININE 0.97    Estimated Creatinine Clearance: 92.1 mL/min (by C-G formula based on SCr of 0.97 mg/dL).   Medical History: Past Medical History:  Diagnosis Date  . MI (myocardial infarction) (HCC)   . Reflux     Assessment: 66 year old male to begin heparin for chest pain No anti-coagulation prior to admission, Scr stable  Goal of Therapy:  Heparin level 0.3-0.7 units/ml Monitor platelets by anticoagulation protocol: Yes   Plan:  Heparin 4000 units iv bolus x 1 Heparin drip at 1400 units / hr Daily heparin level, CBC  Thank you Okey Regal, PharmD [phone]  05/17/2017,9:40 PM

## 2017-05-18 ENCOUNTER — Other Ambulatory Visit (HOSPITAL_COMMUNITY): Payer: Self-pay

## 2017-05-18 ENCOUNTER — Encounter (HOSPITAL_COMMUNITY)
Admission: EM | Disposition: A | Discharge status: Home / Self Care | Payer: Self-pay | Source: Home / Self Care | Attending: Cardiothoracic Surgery

## 2017-05-18 DIAGNOSIS — Z9861 Coronary angioplasty status: Secondary | ICD-10-CM | POA: Diagnosis not present

## 2017-05-18 DIAGNOSIS — I214 Non-ST elevation (NSTEMI) myocardial infarction: Secondary | ICD-10-CM | POA: Diagnosis not present

## 2017-05-18 DIAGNOSIS — I25110 Atherosclerotic heart disease of native coronary artery with unstable angina pectoris: Secondary | ICD-10-CM | POA: Diagnosis not present

## 2017-05-18 DIAGNOSIS — I10 Essential (primary) hypertension: Secondary | ICD-10-CM | POA: Diagnosis present

## 2017-05-18 DIAGNOSIS — T82855A Stenosis of coronary artery stent, initial encounter: Secondary | ICD-10-CM | POA: Diagnosis present

## 2017-05-18 DIAGNOSIS — R079 Chest pain, unspecified: Secondary | ICD-10-CM

## 2017-05-18 DIAGNOSIS — Z01810 Encounter for preprocedural cardiovascular examination: Secondary | ICD-10-CM | POA: Diagnosis not present

## 2017-05-18 DIAGNOSIS — I25119 Atherosclerotic heart disease of native coronary artery with unspecified angina pectoris: Secondary | ICD-10-CM | POA: Insufficient documentation

## 2017-05-18 DIAGNOSIS — R001 Bradycardia, unspecified: Secondary | ICD-10-CM | POA: Diagnosis present

## 2017-05-18 DIAGNOSIS — E785 Hyperlipidemia, unspecified: Secondary | ICD-10-CM

## 2017-05-18 DIAGNOSIS — F17210 Nicotine dependence, cigarettes, uncomplicated: Secondary | ICD-10-CM | POA: Diagnosis present

## 2017-05-18 DIAGNOSIS — F17200 Nicotine dependence, unspecified, uncomplicated: Secondary | ICD-10-CM

## 2017-05-18 DIAGNOSIS — J9811 Atelectasis: Secondary | ICD-10-CM | POA: Diagnosis not present

## 2017-05-18 DIAGNOSIS — K219 Gastro-esophageal reflux disease without esophagitis: Secondary | ICD-10-CM | POA: Diagnosis present

## 2017-05-18 DIAGNOSIS — F1010 Alcohol abuse, uncomplicated: Secondary | ICD-10-CM | POA: Diagnosis present

## 2017-05-18 DIAGNOSIS — G4700 Insomnia, unspecified: Secondary | ICD-10-CM | POA: Diagnosis not present

## 2017-05-18 DIAGNOSIS — I200 Unstable angina: Secondary | ICD-10-CM | POA: Diagnosis not present

## 2017-05-18 DIAGNOSIS — I252 Old myocardial infarction: Secondary | ICD-10-CM | POA: Diagnosis not present

## 2017-05-18 DIAGNOSIS — D62 Acute posthemorrhagic anemia: Secondary | ICD-10-CM | POA: Diagnosis not present

## 2017-05-18 DIAGNOSIS — Z7951 Long term (current) use of inhaled steroids: Secondary | ICD-10-CM | POA: Diagnosis not present

## 2017-05-18 DIAGNOSIS — Z789 Other specified health status: Secondary | ICD-10-CM | POA: Diagnosis not present

## 2017-05-18 DIAGNOSIS — I2510 Atherosclerotic heart disease of native coronary artery without angina pectoris: Secondary | ICD-10-CM | POA: Diagnosis not present

## 2017-05-18 DIAGNOSIS — J449 Chronic obstructive pulmonary disease, unspecified: Secondary | ICD-10-CM | POA: Diagnosis present

## 2017-05-18 DIAGNOSIS — Z7982 Long term (current) use of aspirin: Secondary | ICD-10-CM | POA: Diagnosis not present

## 2017-05-18 DIAGNOSIS — Y831 Surgical operation with implant of artificial internal device as the cause of abnormal reaction of the patient, or of later complication, without mention of misadventure at the time of the procedure: Secondary | ICD-10-CM | POA: Diagnosis present

## 2017-05-18 HISTORY — PX: LEFT HEART CATH AND CORONARY ANGIOGRAPHY: CATH118249

## 2017-05-18 LAB — RAPID URINE DRUG SCREEN, HOSP PERFORMED
AMPHETAMINES: NOT DETECTED
Barbiturates: NOT DETECTED
Benzodiazepines: NOT DETECTED
COCAINE: NOT DETECTED
OPIATES: NOT DETECTED
TETRAHYDROCANNABINOL: NOT DETECTED

## 2017-05-18 LAB — PROTIME-INR
INR: 1.02
PROTHROMBIN TIME: 13.3 s (ref 11.4–15.2)

## 2017-05-18 LAB — CBC
HCT: 42.1 % (ref 39.0–52.0)
HEMOGLOBIN: 14.7 g/dL (ref 13.0–17.0)
MCH: 34.3 pg — AB (ref 26.0–34.0)
MCHC: 34.9 g/dL (ref 30.0–36.0)
MCV: 98.4 fL (ref 78.0–100.0)
Platelets: 153 10*3/uL (ref 150–400)
RBC: 4.28 MIL/uL (ref 4.22–5.81)
RDW: 12.7 % (ref 11.5–15.5)
WBC: 7.3 10*3/uL (ref 4.0–10.5)

## 2017-05-18 LAB — TROPONIN I
Troponin I: 0.06 ng/mL (ref <0.03)
Troponin I: 0.03 ng/mL (ref <0.03)
Troponin I: <0.03 ng/mL (ref <0.03)

## 2017-05-18 LAB — HEPARIN LEVEL (UNFRACTIONATED)
Heparin Unfractionated: 0.18 IU/mL — ABNORMAL LOW (ref 0.30–0.70)
Heparin Unfractionated: 0.64 IU/mL (ref 0.30–0.70)
Heparin Unfractionated: 0.74 IU/mL — ABNORMAL HIGH (ref 0.30–0.70)

## 2017-05-18 LAB — HIV ANTIBODY (ROUTINE TESTING W REFLEX): HIV Screen 4th Generation wRfx: NONREACTIVE

## 2017-05-18 SURGERY — LEFT HEART CATH AND CORONARY ANGIOGRAPHY
Anesthesia: LOCAL

## 2017-05-18 MED ORDER — VERAPAMIL HCL 2.5 MG/ML IV SOLN
INTRAVENOUS | Status: AC
  Filled 2017-05-18: qty 2

## 2017-05-18 MED ORDER — SODIUM CHLORIDE 0.9 % IV SOLN: 250.0000 mL | INTRAVENOUS | Status: DC | PRN

## 2017-05-18 MED ORDER — SODIUM CHLORIDE 0.9% FLUSH
3.0000 mL | INTRAVENOUS | Status: DC | PRN
  Administered 2017-05-19: 3 mL via INTRAVENOUS
  Filled 2017-05-18: qty 3

## 2017-05-18 MED ORDER — SODIUM CHLORIDE 0.9% FLUSH
3.0000 mL | Freq: Two times a day (BID) | INTRAVENOUS | Status: DC
  Administered 2017-05-20 – 2017-05-22 (×4): 3 mL via INTRAVENOUS

## 2017-05-18 MED ORDER — SODIUM CHLORIDE 0.9 % IV SOLN
INTRAVENOUS | Status: AC
  Administered 2017-05-18: 75 mL/h via INTRAVENOUS

## 2017-05-18 MED ORDER — HEPARIN SODIUM (PORCINE) 1000 UNIT/ML IJ SOLN
INTRAMUSCULAR | Status: AC
Start: 2017-05-18 — End: ?
  Filled 2017-05-18: qty 1

## 2017-05-18 MED ORDER — ACETAMINOPHEN 325 MG PO TABS
650.0000 mg | ORAL_TABLET | ORAL | Status: DC | PRN
  Administered 2017-05-19 – 2017-05-21 (×2): 650 mg via ORAL
  Filled 2017-05-18 (×2): qty 2

## 2017-05-18 MED ORDER — IOPAMIDOL (ISOVUE-370) INJECTION 76%
INTRAVENOUS | Status: AC
Start: 2017-05-18 — End: ?
  Filled 2017-05-18: qty 100

## 2017-05-18 MED ORDER — FENTANYL CITRATE (PF) 100 MCG/2ML IJ SOLN
INTRAMUSCULAR | Status: DC | PRN
  Administered 2017-05-18: 25 ug via INTRAVENOUS

## 2017-05-18 MED ORDER — MIDAZOLAM HCL 2 MG/2ML IJ SOLN
INTRAMUSCULAR | Status: DC | PRN
  Administered 2017-05-18 (×2): 1 mg via INTRAVENOUS

## 2017-05-18 MED ORDER — LIDOCAINE HCL 2 % IJ SOLN
INTRAMUSCULAR | Status: DC | PRN
  Administered 2017-05-18: 5 mL

## 2017-05-18 MED ORDER — SODIUM CHLORIDE 0.9% FLUSH: 3.0000 mL | Freq: Two times a day (BID) | INTRAVENOUS | Status: DC

## 2017-05-18 MED ORDER — LIDOCAINE HCL 2 % IJ SOLN
INTRAMUSCULAR | Status: AC
  Filled 2017-05-18: qty 10

## 2017-05-18 MED ORDER — FENTANYL CITRATE (PF) 100 MCG/2ML IJ SOLN
INTRAMUSCULAR | Status: AC
  Filled 2017-05-18: qty 2

## 2017-05-18 MED ORDER — HEPARIN (PORCINE) IN NACL 2-0.9 UNIT/ML-% IJ SOLN
INTRAMUSCULAR | Status: AC
Start: 2017-05-18 — End: ?
  Filled 2017-05-18: qty 1000

## 2017-05-18 MED ORDER — HEPARIN (PORCINE) IN NACL 100-0.45 UNIT/ML-% IJ SOLN: 1600.0000 [IU]/h | INTRAMUSCULAR | Status: DC

## 2017-05-18 MED ORDER — MOMETASONE FURO-FORMOTEROL FUM 100-5 MCG/ACT IN AERO
2.0000 | INHALATION_SPRAY | Freq: Two times a day (BID) | RESPIRATORY_TRACT | Status: DC
  Administered 2017-05-19 – 2017-05-22 (×7): 2 via RESPIRATORY_TRACT
  Filled 2017-05-18: qty 8.8

## 2017-05-18 MED ORDER — SODIUM CHLORIDE 0.9 % IV SOLN
INTRAVENOUS | Status: DC
  Administered 2017-05-18: 100 mL/h via INTRAVENOUS

## 2017-05-18 MED ORDER — HEPARIN (PORCINE) IN NACL 2-0.9 UNIT/ML-% IJ SOLN
INTRAMUSCULAR | Status: AC | PRN
  Administered 2017-05-18: 1000 mL

## 2017-05-18 MED ORDER — SODIUM CHLORIDE 0.9% FLUSH: 3.0000 mL | INTRAVENOUS | Status: DC | PRN

## 2017-05-18 MED ORDER — ZOLPIDEM TARTRATE 5 MG PO TABS
5.0000 mg | ORAL_TABLET | Freq: Once | ORAL | Status: AC
  Administered 2017-05-19: 5 mg via ORAL
  Filled 2017-05-18: qty 1

## 2017-05-18 MED ORDER — ROSUVASTATIN CALCIUM 20 MG PO TABS
40.0000 mg | ORAL_TABLET | Freq: Every day | ORAL | Status: DC
  Administered 2017-05-19 – 2017-05-23 (×3): 40 mg via ORAL
  Filled 2017-05-18: qty 2
  Filled 2017-05-18 (×3): qty 1

## 2017-05-18 MED ORDER — ASPIRIN 81 MG PO CHEW: 81.0000 mg | CHEWABLE_TABLET | ORAL | Status: DC

## 2017-05-18 MED ORDER — HEPARIN SODIUM (PORCINE) 1000 UNIT/ML IJ SOLN
INTRAMUSCULAR | Status: DC | PRN
  Administered 2017-05-18: 4500 [IU] via INTRAVENOUS

## 2017-05-18 MED ORDER — HEPARIN BOLUS VIA INFUSION
2000.0000 [IU] | Freq: Once | INTRAVENOUS | Status: AC
  Administered 2017-05-18: 2000 [IU] via INTRAVENOUS
  Filled 2017-05-18: qty 2000

## 2017-05-18 MED ORDER — VERAPAMIL HCL 2.5 MG/ML IV SOLN
INTRAVENOUS | Status: DC | PRN
  Administered 2017-05-18: 18:00:00 via INTRA_ARTERIAL

## 2017-05-18 MED ORDER — ONDANSETRON HCL 4 MG/2ML IJ SOLN: 4.0000 mg | Freq: Four times a day (QID) | INTRAMUSCULAR | Status: DC | PRN

## 2017-05-18 MED ORDER — HEPARIN (PORCINE) IN NACL 100-0.45 UNIT/ML-% IJ SOLN
1600.0000 [IU]/h | INTRAMUSCULAR | Status: DC
  Administered 2017-05-19 – 2017-05-20 (×4): 1600 [IU]/h via INTRAVENOUS
  Filled 2017-05-18 (×5): qty 250

## 2017-05-18 MED ORDER — MIDAZOLAM HCL 2 MG/2ML IJ SOLN
INTRAMUSCULAR | Status: AC
  Filled 2017-05-18: qty 2

## 2017-05-18 MED ORDER — IOPAMIDOL (ISOVUE-370) INJECTION 76%
INTRAVENOUS | Status: DC | PRN
  Administered 2017-05-18: 90 mL via INTRA_ARTERIAL

## 2017-05-18 SURGICAL SUPPLY — 14 items
CATH INFINITI 5FR AL1 (CATHETERS) ×2 IMPLANT
CATH INFINITI 5FR ANG PIGTAIL (CATHETERS) ×2 IMPLANT
CATH INFINITI 5FR JL4 (CATHETERS) ×2 IMPLANT
CATH INFINITI JR4 5F (CATHETERS) ×2 IMPLANT
DEVICE RAD COMP TR BAND LRG (VASCULAR PRODUCTS) ×2 IMPLANT
GLIDESHEATH SLEND SS 6F .021 (SHEATH) ×2 IMPLANT
GUIDEWIRE INQWIRE 1.5J.035X260 (WIRE) ×1 IMPLANT
INQWIRE 1.5J .035X260CM (WIRE) ×2
KIT HEART LEFT (KITS) ×2 IMPLANT
PACK CARDIAC CATHETERIZATION (CUSTOM PROCEDURE TRAY) ×2 IMPLANT
SYR MEDRAD MARK V 150ML (SYRINGE) ×2 IMPLANT
TRANSDUCER W/STOPCOCK (MISCELLANEOUS) ×2 IMPLANT
TUBING CIL FLEX 10 FLL-RA (TUBING) ×2 IMPLANT
WIRE EMERALD 3MM-J .035X150CM (WIRE) ×2 IMPLANT

## 2017-05-18 NOTE — Progress Notes (Signed)
   Brief Cardiology Rounding Note - Seen earlier this AM by covering MD for Consultation.  Pt presented ater ~10-15 min episode of severe SSCP consistent with prior MI angina - resolved with SL NTG. Minimal Troponin elevation.   He has h/o CAD - prior PTCA of RPL1 no stent - completed ~1 month of Brilinta (did not have insurance)  Cath 01/2015:  Mid RCA lesion, 20% in stent restenosis.  Prox LAD to Mid LAD lesion, 50% stenosed.  1st RPLB lesion, 100% stenosed which was the culprit for his presentation. He was treated with balloon angioplasty and his symptoms improved. Please see below.  The left ventricular systolic function is normal.  Dist LAD lesion, 90% stenosed. Culprit for his presentation today was the occlusion in the terminal portion of the PLA. Since he was still reporting 8/10 pain, we attempted angioplasty. The flow improved somewhat but the occlusion was not completely cleared. His pain improved significantly post procedure so we stopped the procedure at that point. Since it was a small vessel and his symptoms improved, I felt that the risk of further attempts at angioplasty was greater than any potential benefit.      He now has Medicare & agrees to take Brilinta (Plavix intolerant) if PCI is needed, but is reluctant to take other medications.  Exam reveals: pleasant gentleman, NAD.  PERRL, EOMI.  Heart:Bradycardic with RR, no M/R.G. Lungs CTAB, non-labored.  No edema.  Radial & pedal pulses intact.  EKG - S Brady with subtle Lateral TW flattening.  Tele - S Brady 40-50s.  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  Continue IV Heparin No Beta Blocker 2/2 bradycardia  Can try to start Statin - but he will most likely not take; continue Lovaza. (check FLP in AM). Monitor for SSx of EtOH withdrawal. Smoking cessation counseling provided.   Darolyn Double, M.D., M.S. Interventional Cardiologist   Pager # [phone] Phone # [phone] [address] [place], [place] [address]   

## 2017-05-18 NOTE — Progress Notes (Addendum)
PROGRESS NOTE  Noah Walker XLK:[id number] DOB: 09-17-1950 DOA: 05/17/2017 PCP: Noah DubinFutrell, Thomas M., MD  HPI/Recap of past 24 hours: Noah Walker is a 66 year old Caucasian male with past medical history significant for coronary artery disease status post stenting in 2016, ongoing tobacco use, alcohol use who [facility]17 with complaints of chest pain. Patient was admitted for possible unstable angina.  Cardiology consulted and is planning for a left heart catheterization today.  At the time of this visit this morning, the patient denies any chest pain, dyspnea, or palpitation. He admits to continuous tobacco use and declines any help to quit smoking.  Exam:   General: 66 year old Caucasian male thin build sitting up in bed in no apparent acute distress alert and oriented x4  Cardiovascular: Heart is regular rate and rhythm S1-S2 noted no murmurs rubs or gallops  Respiratory: Lungs are clear to auscultation bilaterally with no wheezes or rhonchi noted  Abdomen: Soft nontender with normal bowel sounds quadrants  Musculoskeletal: No noted atrophy; moves all 4 limbs freely, strength 5 out of 5 bilaterally  Skin: No noted rashes or open lesions  Psychiatry: Mood is appropriate for condition and setting   Objective: Vitals:   05/17/17 2257 05/18/17 0452 05/18/17 0814 05/18/17 1406  BP: 135/85 110/65  113/70  Pulse: 64 (!) 53  60  Resp: 18 18  18   Temp: 97.6 F (36.4 C) 97.8 F (36.6 C)  98 F (36.7 C)  TempSrc: Oral Oral  Oral  SpO2: 95% 98%  98%  Weight: 95.8 kg (211 lb 1.6 oz)  95.2 kg (209 lb 14.4 oz)   Height: 6' (1.829 m)       Intake/Output Summary (Last 24 hours) at 05/18/17 1634 Last data filed at 05/18/17 0829  Gross per 24 hour  Intake            92.33 ml  Output                0 ml  Net            92.33 ml   Filed Weights   05/17/17 1914 05/17/17 2257 05/18/17 0814  Weight: 99.8 kg (220 lb) 95.8 kg (211 lb 1.6 oz) 95.2 kg (209  lb 14.4 oz)     Data Reviewed: CBC:  Recent Labs Lab 05/17/17 1922 05/18/17 0528  WBC 6.9 7.3  HGB 15.0 14.7  HCT 41.7 42.1  MCV 97.4 98.4  PLT 176 153   Basic Metabolic Panel:  Recent Labs Lab 05/17/17 1922  NA 134*  K 4.0  CL 107  CO2 22  GLUCOSE 98  BUN 12  CREATININE 0.97  CALCIUM 8.3*   GFR: Estimated Creatinine Clearance: 90.9 mL/min (by C-G formula based on SCr of 0.97 mg/dL). Liver Function Tests: No results for input(s): AST, ALT, ALKPHOS, BILITOT, PROT, ALBUMIN in the last 168 hours. No results for input(s): LIPASE, AMYLASE in the last 168 hours. No results for input(s): AMMONIA in the last 168 hours. Coagulation Profile:  Recent Labs Lab 05/18/17 1415  INR 1.02   Cardiac Enzymes:  Recent Labs Lab 05/18/17 0011 05/18/17 0528 05/18/17 1112  TROPONINI 0.06* 0.03* <0.03   BNP (last 3 results) No results for input(s): PROBNP in the last 8760 hours. HbA1C: No results for input(s): HGBA1C in the last 72 hours. CBG: No results for input(s): GLUCAP in the last 168 hours. Lipid Profile: No results for input(s): CHOL, HDL, LDLCALC, TRIG, CHOLHDL, LDLDIRECT in  the last 72 hours. Thyroid Function Tests: No results for input(s): TSH, T4TOTAL, FREET4, T3FREE, THYROIDAB in the last 72 hours. Anemia Panel: No results for input(s): VITAMINB12, FOLATE, FERRITIN, TIBC, IRON, RETICCTPCT in the last 72 hours. Urine analysis: No results found for: COLORURINE, APPEARANCEUR, LABSPEC, PHURINE, GLUCOSEU, HGBUR, BILIRUBINUR, KETONESUR, PROTEINUR, UROBILINOGEN, NITRITE, LEUKOCYTESUR Sepsis Labs: @LABRCNTIP (procalcitonin:4,lacticidven:4)  )No results found for this or any previous visit (from the past 240 hour(s)).    Studies: Dg Chest 2 View  Result Date: 05/17/2017 CLINICAL DATA:  Initial evaluation for acute left-sided chest pain. EXAM: CHEST  2 VIEW COMPARISON:  Prior radiograph 05/30/2012. FINDINGS: The cardiac and mediastinal silhouettes are stable in  size and contour, and remain within normal limits. The lungs are normally inflated. No airspace consolidation, pleural effusion, or pulmonary edema is identified. There is no pneumothorax. No acute osseous abnormality identified. IMPRESSION: No active cardiopulmonary disease. Electronically Signed   By: Rise Mu WalkerD.   On: 05/17/2017 21:03    Scheduled Meds: . aspirin  81 mg Oral Pre-Cath  . aspirin EC  325 mg Oral Daily  . folic acid  1 mg Oral Daily  . LORazepam  0-4 mg Intravenous Q6H   Followed by  . [START ON 05/20/2017] LORazepam  0-4 mg Intravenous Q12H  . multivitamin with minerals  1 tablet Oral Daily  . nitroGLYCERIN  1 inch Topical Once  . omega-3 acid ethyl esters  1,000 mg Oral Daily  . rosuvastatin  40 mg Oral q1800  . sodium chloride flush  3 mL Intravenous Q12H  . thiamine  100 mg Oral Daily   Or  . thiamine  100 mg Intravenous Daily    Continuous Infusions: . sodium chloride    . sodium chloride 100 mL/hr (05/18/17 1102)  . 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 -Get echo  Coronary artery disease status post stenting in 2016 -Patient advised to quit smoking but declines help -Continue aspirin, rosuvastatin  Bradycardia -Most likely cardiac -Left heart cath planned today -cardio following -hold beta  blockers  Tobacco abuse -Refuses to quit  Alcohol abuse -Alcohol withdrawal protocol in place per CIWA protocol   Code Status: Full code  Family Communication: None  Disposition Plan: Will stay another midnight for close monitoring after left heart cath which is planned today.   Consultants:  Cardiology  Procedures:  Left heart cath  Antimicrobials:  Not indicated  DVT prophylaxis:  Heparin drip   Darlin Drop, MD [facility] Pager [phone]  If 7PM-7AM, please contact night-coverage [email or web address] Password [facility] 05/18/2017, 4:34 PM

## 2017-05-18 NOTE — Interval H&P Note (Signed)
History and Physical Interval Note:  05/18/2017 5:29 PM  Noah Walker  has presented today for surgery, with the diagnosis of cp  The various methods of treatment have been discussed with the patient and family. After consideration of risks, benefits and other options for treatment, the patient has consented to  Procedure(s): LEFT HEART CATH AND CORONARY ANGIOGRAPHY (N/A) possible coronary angioplasty as a surgical intervention .  The patient's history has been reviewed, patient examined, no change in status, stable for surgery.  I have reviewed the patient's chart and labs.  Questions were answered to the patient's satisfaction.     Lelar Farewell, Reuel Boom

## 2017-05-18 NOTE — Progress Notes (Signed)
ANTICOAGULATION CONSULT NOTE - Follow Up Consult  Pharmacy Consult for heparin Indication: 2V CAD   Patient Measurements: Height: 6' (182.9 cm) Weight: 209 lb 14.4 oz (95.2 kg) IBW/kg (Calculated) : 77.6 Heparin Dosing Weight: 95.2 kg  Labs:  Recent Labs  05/17/17 1922 05/18/17 0011 05/18/17 0528 05/18/17 1112 05/18/17 1415  HGB 15.0  --  14.7  --   --   HCT 41.7  --  42.1  --   --   PLT 176  --  153  --   --   LABPROT  --   --   --   --  13.3  INR  --   --   --   --  1.02  HEPARINUNFRC  --  0.18* 0.64 0.74*  --   CREATININE 0.97  --   --   --   --   TROPONINI  --  0.06* 0.03* <0.03  --     Assessment: 66 y/o male admitted with chest pain s/p cath which showed 2V CAD. TCTS consulted for possible CABG. Pharmacy consulted to resume heparin drip 6 hrs post sheath removal. Sheath out at 18:03. No bleeding noted.  Goal of Therapy:  Heparin level 0.3-0.7 units/ml   Plan:  Resume heparin drip rate at 1600 units/hr with no bolus at midnight 6 hr heparin level Daily heparin level, CBC F/U TCTS consult   Loura Back, PharmD, BCPS Clinical Pharmacist Phone for today [phone] Main pharmacy - [phone] 05/18/2017 7:03 PM

## 2017-05-18 NOTE — Progress Notes (Signed)
Day of Surgery Procedure(s) (LRB): LEFT HEART CATH AND CORONARY ANGIOGRAPHY (N/A) Subjective: Patient examined, coronary angiogram images personally reviewed and counseled with patient  66 year old hypertensive smoker with prior history of PCI of the RCA now presents with severe chest pain consistent with unstable angina. Cardiac catheterization performed via right radial artery demonstrates 95% ostial LAD stenosis, 70% stenosis just distal to the previously placed RCA PCI stent. EF is 45%. LVEDP 22 mmHg.  Patient's past history is significant for COPD with active smoking, daily alcohol intake, and previous left leg femur fracture and GERD. Objective:  Vital signs in last 24 hours: Temp:  [97.6 F (36.4 C)-98 F (36.7 C)] 97.7 F (36.5 C) (10/22 2057) Pulse Rate:  [0-257] 46 (10/22 2057) Cardiac Rhythm: Normal sinus rhythm (10/22 1900) Resp:  [0-25] 18 (10/22 2057) BP: (110-168)/(65-97) 147/71 (10/22 2057) SpO2:  [0 %-98 %] 98 % (10/22 2057) Weight:  [209 lb 14.4 oz (95.2 kg)-211 lb 1.6 oz (95.8 kg)] 209 lb 14.4 oz (95.2 kg) (10/22 0814)  Hemodynamic parameters for last 24 hours:  stable  Intake/Output from previous day: 10/21 0701 - 10/22 0700 In: 92.3 [I.V.:72.3] Out: -  Intake/Output this shift: Total I/O In: 240 [P.O.:240] Out: -       Physical Exam  General: Well nourished middle-aged male no acute distress HEENT: Normocephalic pupils equal , dentition adequate Neck: Supple without JVD, adenopathy, or bruit Chest: Clear to auscultation, symmetrical breath sounds, no rhonchi, no tenderness             or deformity Cardiovascular: Regular rate and rhythm, no murmur, no gallop, peripheral pulses             palpable in all extremities Abdomen:  Soft, nontender, no palpable mass or organomegaly Extremities: Warm, well-perfused, no clubbing cyanosis edema or tenderness,              no venous stasis changes of the legs Rectal/GU: Deferred Neuro: Grossly non--focal and  symmetrical throughout Skin: Clean and dry without rash or ulceration   Lab Results:  Recent Labs  05/17/17 1922 05/18/17 0528  WBC 6.9 7.3  HGB 15.0 14.7  HCT 41.7 42.1  PLT 176 153   BMET:  Recent Labs  05/17/17 1922  NA 134*  K 4.0  CL 107  CO2 22  GLUCOSE 98  BUN 12  CREATININE 0.97  CALCIUM 8.3*    PT/INR:  Recent Labs  05/18/17 1415  LABPROT 13.3  INR 1.02   ABG    Component Value Date/Time   TCO2 27 02/07/2015 1645   CBG (last 3)  No results for input(s): GLUCAP in the last 72 hours.  Assessment/Plan: S/P Procedure(s) (LRB): LEFT HEART CATH AND CORONARY ANGIOGRAPHY (N/A) Patient would benefit from CABG with bypass grafts to LAD and RCA. Patient will need to remain on IV heparin until his surgery scheduled for the first available opening this week. Currently next opening is Friday, October 26.  LOS: 0 days    Kathlee Nations Trigt III 05/18/2017

## 2017-05-18 NOTE — Plan of Care (Signed)
Problem: Activity: Goal: Risk for activity intolerance will decrease Outcome: Progressing Pt. No longer SOB with activity. States he feels better.

## 2017-05-18 NOTE — Progress Notes (Signed)
ANTICOAGULATION CONSULT NOTE - Follow Up Consult  Pharmacy Consult for heparin Indication: chest pain/ACS   Patient Measurements: Height: 6' (182.9 cm) Weight: 209 lb 14.4 oz (95.2 kg) IBW/kg (Calculated) : 77.6 Heparin Dosing Weight: 95.2 kg  Labs:  Recent Labs  05/17/17 1922 05/18/17 0011 05/18/17 0528 05/18/17 1112  HGB 15.0  --  14.7  --   HCT 41.7  --  42.1  --   PLT 176  --  153  --   HEPARINUNFRC  --  0.18* 0.64 0.74*  CREATININE 0.97  --   --   --   TROPONINI  --  0.06* 0.03* <0.03    Assessment: 66yo male with heparin level = 0.74, supratherapeutic on heparin infusion 1700 units/hr with initial dosing for chest pain. Plan for cardiac cath today. CBC wnl and stable.  No bleeding reported.   Goal of Therapy:  Heparin level 0.3-0.7 units/ml   Plan:  Decrease heparin drip rate to 1600 units/hr Daily HL, CBC Follow up after cath   Noah Delaine, RPh Clinical Pharmacist Pager: [phone]-[phone] [phone] 330p-1030p phone [phone] or [phone] Main pharmacy [phone] 05/18/2017,1:10 PM

## 2017-05-18 NOTE — Progress Notes (Signed)
Received patient post Ca Cath, patient alert and oriented, no complaints of any discomfort. Cath site R radial level 0. Will monitor accordingly.

## 2017-05-18 NOTE — H&P (View-Only) (Signed)
   Brief Cardiology Rounding Note - Seen earlier this AM by covering MD for Consultation.  Pt presented ater ~10-15 min episode of severe SSCP consistent with prior MI angina - resolved with SL NTG. Minimal Troponin elevation.   He has h/o CAD - prior PTCA of RPL1 no stent - completed ~1 month of Brilinta (did not have insurance)  Cath 01/2015:  Mid RCA lesion, 20% in stent restenosis.  Prox LAD to Mid LAD lesion, 50% stenosed.  1st RPLB lesion, 100% stenosed which was the culprit for his presentation. He was treated with balloon angioplasty and his symptoms improved. Please see below.  The left ventricular systolic function is normal.  Dist LAD lesion, 90% stenosed. Culprit for his presentation today was the occlusion in the terminal portion of the PLA. Since he was still reporting 8/10 pain, we attempted angioplasty. The flow improved somewhat but the occlusion was not completely cleared. His pain improved significantly post procedure so we stopped the procedure at that point. Since it was a small vessel and his symptoms improved, I felt that the risk of further attempts at angioplasty was greater than any potential benefit.      He now has Medicare & agrees to take Brilinta (Plavix intolerant) if PCI is needed, but is reluctant to take other medications.  Exam reveals: pleasant gentleman, NAD.  PERRL, EOMI.  Heart:Bradycardic with RR, no M/R.G. Lungs CTAB, non-labored.  No edema.  Radial & pedal pulses intact.  EKG Mena Pauls with subtle Lateral TW flattening.  Tele - S Brady 40-50s.  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  Continue IV Heparin No Beta Blocker 2/2 bradycardia  Can try to start Statin - but he will most likely not take; continue Lovaza. (check FLP in AM). Monitor for SSx of EtOH withdrawal. Smoking cessation counseling provided.   Bryan Lemma, M.D., M.S. Interventional Cardiologist   Pager # [phone] Phone # [phone] [address] [place], [place] [address]

## 2017-05-18 NOTE — Progress Notes (Signed)
Pt. Troponin 0.06. Toniann Fail, MD paged to make aware.

## 2017-05-18 NOTE — Progress Notes (Signed)
Pt. Requesting med to help him sleep tonight. On call for [facility] made aware. Livi Mcgann, Cheryll Dessert

## 2017-05-18 NOTE — Progress Notes (Signed)
RN informed by CCMD that patients HR down to 40s non-sustained. MD on the floor and informed. Pt. Asymptomatic and in stable condition resting in bed. RN will continue to monitor pt. For changes in condition. Natanel Snavely, Cheryll Dessert

## 2017-05-18 NOTE — Progress Notes (Addendum)
ANTICOAGULATION CONSULT NOTE - Follow Up Consult  Pharmacy Consult for heparin Indication: chest pain/ACS  Labs:  Recent Labs  05/17/17 1922 05/18/17 0011 05/18/17 0528  HGB 15.0  --  14.7  HCT 41.7  --  42.1  PLT 176  --  153  HEPARINUNFRC  --  0.18* 0.64  CREATININE 0.97  --   --   TROPONINI  --  0.06* 0.03*    Assessment: 66yo male subtherapeutic on heparin with initial dosing for CP.  Goal of Therapy:  Heparin level 0.3-0.7 units/ml   Plan:  Will give small heparin bolus 2000 units and increase gtt by 3 units/kg/hr to 1700 units/hr and check level with next troponin lab draw.  Noah Walker, PharmD, BCPS  05/18/2017,1:39 AM   ADDENDUM: Now therapeutic on heparin. Will continue gtt at current rate and confirm stable with additional level.  VB   7:23 AM

## 2017-05-19 ENCOUNTER — Encounter (HOSPITAL_COMMUNITY): Payer: Self-pay | Admitting: Internal Medicine

## 2017-05-19 ENCOUNTER — Inpatient Hospital Stay (HOSPITAL_COMMUNITY): Payer: Medicare Other

## 2017-05-19 ENCOUNTER — Encounter (HOSPITAL_COMMUNITY): Payer: Medicare Other

## 2017-05-19 DIAGNOSIS — Z789 Other specified health status: Secondary | ICD-10-CM

## 2017-05-19 DIAGNOSIS — I25110 Atherosclerotic heart disease of native coronary artery with unstable angina pectoris: Secondary | ICD-10-CM

## 2017-05-19 DIAGNOSIS — R001 Bradycardia, unspecified: Secondary | ICD-10-CM

## 2017-05-19 LAB — CBC WITH DIFFERENTIAL/PLATELET
BASOS ABS: 0 10*3/uL (ref 0.0–0.1)
BASOS PCT: 1 %
EOS ABS: 0.2 10*3/uL (ref 0.0–0.7)
EOS PCT: 2 %
HCT: 43.6 % (ref 39.0–52.0)
HEMOGLOBIN: 15 g/dL (ref 13.0–17.0)
Lymphocytes Relative: 40 %
Lymphs Abs: 2.6 10*3/uL (ref 0.7–4.0)
MCH: 33.9 pg (ref 26.0–34.0)
MCHC: 34.4 g/dL (ref 30.0–36.0)
MCV: 98.6 fL (ref 78.0–100.0)
Monocytes Absolute: 0.8 10*3/uL (ref 0.1–1.0)
Monocytes Relative: 13 %
NEUTROS PCT: 44 %
Neutro Abs: 2.8 10*3/uL (ref 1.7–7.7)
PLATELETS: 142 10*3/uL — AB (ref 150–400)
RBC: 4.42 MIL/uL (ref 4.22–5.81)
RDW: 12.7 % (ref 11.5–15.5)
WBC: 6.4 10*3/uL (ref 4.0–10.5)

## 2017-05-19 LAB — URINALYSIS, ROUTINE W REFLEX MICROSCOPIC
Bilirubin Urine: NEGATIVE
Glucose, UA: NEGATIVE mg/dL
Hgb urine dipstick: NEGATIVE
Ketones, ur: NEGATIVE mg/dL
Leukocytes, UA: NEGATIVE
Nitrite: NEGATIVE
Protein, ur: NEGATIVE mg/dL
Specific Gravity, Urine: 1.011 (ref 1.005–1.030)
pH: 6 (ref 5.0–8.0)

## 2017-05-19 LAB — COMPREHENSIVE METABOLIC PANEL
ALBUMIN: 3.7 g/dL (ref 3.5–5.0)
ALT: 18 U/L (ref 17–63)
AST: 18 U/L (ref 15–41)
Alkaline Phosphatase: 58 U/L (ref 38–126)
Anion gap: 7 (ref 5–15)
BUN: 6 mg/dL (ref 6–20)
CHLORIDE: 105 mmol/L (ref 101–111)
CO2: 24 mmol/L (ref 22–32)
CREATININE: 0.89 mg/dL (ref 0.61–1.24)
Calcium: 8.7 mg/dL — ABNORMAL LOW (ref 8.9–10.3)
GFR calc non Af Amer: >60 mL/min (ref >60)
GLUCOSE: 99 mg/dL (ref 65–99)
Potassium: 4.1 mmol/L (ref 3.5–5.1)
SODIUM: 136 mmol/L (ref 135–145)
Total Bilirubin: 0.6 mg/dL (ref 0.3–1.2)
Total Protein: 6.2 g/dL — ABNORMAL LOW (ref 6.5–8.1)

## 2017-05-19 LAB — LIPID PANEL
Cholesterol: 192 mg/dL (ref 0–200)
HDL: 41 mg/dL (ref >40)
LDL CALC: 117 mg/dL — AB (ref 0–99)
Total CHOL/HDL Ratio: 4.7 RATIO
Triglycerides: 168 mg/dL — ABNORMAL HIGH (ref <150)
VLDL: 34 mg/dL (ref 0–40)

## 2017-05-19 LAB — HEPARIN LEVEL (UNFRACTIONATED)
HEPARIN UNFRACTIONATED: 0.27 [IU]/mL — AB (ref 0.30–0.70)
Heparin Unfractionated: 0.3 IU/mL (ref 0.30–0.70)

## 2017-05-19 LAB — TSH: TSH: 3.229 u[IU]/mL (ref 0.350–4.500)

## 2017-05-19 LAB — ECHOCARDIOGRAM COMPLETE
Height: 72 in
Weight: 3380.8 oz

## 2017-05-19 LAB — PROTIME-INR
INR: 0.98
PROTHROMBIN TIME: 12.9 s (ref 11.4–15.2)

## 2017-05-19 LAB — SURGICAL PCR SCREEN
MRSA, PCR: NEGATIVE
Staphylococcus aureus: NEGATIVE

## 2017-05-19 LAB — PLATELET INHIBITION P2Y12: Platelet Function  P2Y12: 245 [PRU] (ref 194–418)

## 2017-05-19 MED ORDER — VITAMIN B-1 100 MG PO TABS
100.0000 mg | ORAL_TABLET | Freq: Every day | ORAL | Status: DC
  Administered 2017-05-19 – 2017-05-21 (×3): 100 mg via ORAL
  Filled 2017-05-19 (×3): qty 1

## 2017-05-19 MED ORDER — NICOTINE 21 MG/24HR TD PT24
21.0000 mg | MEDICATED_PATCH | Freq: Every day | TRANSDERMAL | Status: DC
  Administered 2017-05-19 – 2017-05-22 (×4): 21 mg via TRANSDERMAL
  Filled 2017-05-19 (×4): qty 1

## 2017-05-19 NOTE — Progress Notes (Signed)
Pt has been walking independently although he does st he has light chest tightness. He is also stating his throat is hurting/burning, esp with deep breaths like the IS. Discussed sternal precautions, IS, mobility, and d/c planning. Gave OHS, video, careguide. He, his son, and girlfriend voice understanding. He has been wanting a cigarette and actually smoked in [place] this am. I gave him a fake cigarette and he is very happy about it, keeping it in his mouth. He also requests a nicotine patch. Pt is thinking about quitting smoking. Encouraged walking within reason and to notify RN of sx. [phone] Ethelda Chick CES, ACSM 3:45 PM 05/19/2017

## 2017-05-19 NOTE — Progress Notes (Signed)
Progress Note  Patient Name: Noah Walker Date of Encounter: 05/19/2017  Referring Physician: Dr. Adela LankFloyd Primary Physician: Dr. Fabienne BrunsFutrell Primary Cardiologist: Dr. Karin GoldenZan [facility]    Subjective   Resting comfortably - as long as he does not really do much walking. Happily holding fake cigarette.  Inpatient Medications    Scheduled Meds: . aspirin EC  325 mg Oral Daily  . folic acid  1 mg Oral Daily  . LORazepam  0-4 mg Intravenous Q6H   Followed by  . [START ON 05/20/2017] LORazepam  0-4 mg Intravenous Q12H  . mometasone-formoterol  2 puff Inhalation BID  . multivitamin with minerals  1 tablet Oral Daily  . nicotine  21 mg Transdermal Daily  . nitroGLYCERIN  1 inch Topical Once  . rosuvastatin  40 mg Oral q1800  . sodium chloride flush  3 mL Intravenous Q12H  . thiamine  100 mg Oral Daily   Continuous Infusions: . sodium chloride    . heparin 1,600 Units/hr (05/19/17 1006)   PRN Meds: sodium chloride, acetaminophen, LORazepam **OR** LORazepam, nitroGLYCERIN, ondansetron (ZOFRAN) IV, sodium chloride flush   Vital Signs    Vitals:   05/19/17 0019 05/19/17 0500 05/19/17 0843 05/19/17 1057  BP: (!) 111/54 123/79  (!) 139/93  Pulse: (!) 51 (!) 50  62  Resp:  18  18  Temp:  97.8 F (36.6 C)  97.8 F (36.6 C)  TempSrc:  Oral  Oral  SpO2:  96% 97% 97%  Weight:  211 lb 4.8 oz (95.8 kg)    Height:        Intake/Output Summary (Last 24 hours) at 05/19/17 1617 Last data filed at 05/19/17 1058  Gross per 24 hour  Intake           863.77 ml  Output             1825 ml  Net          -961.23 ml   Filed Weights   05/17/17 2257 05/18/17 0814 05/19/17 0500  Weight: 211 lb 1.6 oz (95.8 kg) 209 lb 14.4 oz (95.2 kg) 211 lb 4.8 oz (95.8 kg)    Telemetry    S Brady 50s - Personally Reviewed  ECG    Sinus bradycardia -54 bpm; Inferior infarct , age undetermined, ST & T wave abnormality, consider anterolateral ischemia - Personally Reviewed  Physical Exam    Physical Exam  Constitutional: He is oriented to person, place, and time. He appears well-developed and well-nourished. No distress.  HENT:  Head: Normocephalic and atraumatic.  Eyes: EOM are normal.  Neck: No hepatojugular reflux and no JVD present. Carotid bruit is not present.  Cardiovascular: Regular rhythm, normal heart sounds and intact distal pulses.   Occasional extrasystoles are present. Bradycardia present.  PMI is not displaced.  Exam reveals no gallop and no friction rub.   No murmur heard. Pulmonary/Chest: Effort normal and breath sounds normal. No respiratory distress. He has no wheezes. He has no rales.  Abdominal: Soft. Bowel sounds are normal. He exhibits no distension. There is no tenderness. There is no rebound.  Musculoskeletal: Normal range of motion. He exhibits no edema or deformity.  Neurological: He is alert and oriented to person, place, and time.  Skin: Skin is warm and dry. No rash noted. No erythema.  Psychiatric: He has a normal mood and affect. His behavior is normal. Judgment and thought content normal.  Nursing note and vitals reviewed.   Labs  Chemistry Recent Labs Lab 05/17/17 1922 05/19/17 0539  NA 134* 136  K 4.0 4.1  CL 107 105  CO2 22 24  GLUCOSE 98 99  BUN 12 6  CREATININE 0.97 0.89  CALCIUM 8.3* 8.7*  PROT  --  6.2*  ALBUMIN  --  3.7  AST  --  18  ALT  --  18  ALKPHOS  --  58  BILITOT  --  0.6  GFRNONAA >60 >60  GFRAA >60 >60  ANIONGAP 5 7     Hematology Recent Labs Lab 05/17/17 1922 05/18/17 0528 05/19/17 0539  WBC 6.9 7.3 6.4  RBC 4.28 4.28 4.42  HGB 15.0 14.7 15.0  HCT 41.7 42.1 43.6  MCV 97.4 98.4 98.6  MCH 35.0* 34.3* 33.9  MCHC 36.0 34.9 34.4  RDW 12.7 12.7 12.7  PLT 176 153 142*    Cardiac Enzymes Recent Labs Lab 05/18/17 0011 05/18/17 0528 05/18/17 1112  TROPONINI 0.06* 0.03* <0.03    Recent Labs Lab 05/17/17 1944  TROPIPOC 0.01     BNPNo results for input(s): BNP, PROBNP in the last 168  hours.   DDimer No results for input(s): DDIMER in the last 168 hours.   Radiology    Dg Chest 2 View  Result Date: 05/17/2017 CLINICAL DATA:  Initial evaluation for acute left-sided chest pain. EXAM: CHEST  2 VIEW COMPARISON:  Prior radiograph 05/30/2012. FINDINGS: The cardiac and mediastinal silhouettes are stable in size and contour, and remain within normal limits. The lungs are normally inflated. No airspace consolidation, pleural effusion, or pulmonary edema is identified. There is no pneumothorax. No acute osseous abnormality identified. IMPRESSION: No active cardiopulmonary disease. Electronically Signed   By: Rise Mu M.D.   On: 05/17/2017 21:03    Cardiac Studies   2D Echo:  LEFT HEART CATH AND CORONARY ANGIOGRAPHY - 05/18/2017    Prox RCA to Mid RCA stent, 0 %stenosed.  Mid RCA lesion, 20 %stenosed.  Dist LAD lesion, 90 %stenosed.  Prox RCA lesion, 70 %stenosed.  Prox LAD to Mid LAD lesion, 95 %stenosed.  Prox Cx to Mid Cx lesion, 30 %stenosed.   Findings: 1. Significant 2v CAD with ulcerated 95% ostial LAD lesion and hazy 60-70% mid RCA lesion prior to stent 2. EF 45% with apical and inferoapical HK        Patient Profile     66 yo man with PMH CAD s/p prior PCI who presented on October 21 with chest pain. He was initially seen by Dr. Cristal Deer in consult at the request of Dr. Adela Lank for assistance with management.  He reported that he was in his usual state of health until evening of 10/21. He had been at home most of the day, but when he went to get into his truck he felt a rapid onset heavy/dull discomfort across his chest that radiated to his left shoulder. This was similar to how he felt during his previous MI. He also felt short of breath and diaphoretic at the time. His symptoms were resolved with NG, and EMS brought him to the ER for further evaluation.   Symptoms were concerning for unstable angina he was therefore referred for cardiac  catheterization which revealed severe LAD and RCA disease. CT surgical consult was recommended.  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  No doubt is the LAD lesion that is the leading source for his angina. Recommendation is coronary bypass grafting. Seen by Dr. Donata Clay yesterday who does agree and recommend CABG  Plan:  Unstable angina with multivessel CAD: Plan CABG Friday  Restarted IV heparin  No beta blocker because of bradycardia  He was placed on Nitropaste  Continue aspirin plus statin    Hyperlipidemia: Start on rosuvastatin  Smoking cessation counseling given. Nicotine patch ordered.  Bradycardic - no Beta Blocker  Per IM Team  Alcohol abuse: On CIWA protocol - no SSx of withdrawal. Thiamine & Folate  Nebs for likley COPD    Remain stable. Anticipate CABG on Friday. Reluctant to have him go home based on the severity of LAD lesion. Not a good PCI target.   For questions or updates, please contact [facility] Please consult [email or web address] for contact info under Cardiology/STEMI.      Signed, Bryan Lemma, MD  05/19/2017, 4:17 PM

## 2017-05-19 NOTE — Progress Notes (Signed)
ANTICOAGULATION CONSULT NOTE - Follow Up Consult  Pharmacy Consult for heparin Indication: CAD awaiting CABG  Labs:  Recent Labs  05/17/17 1922  05/18/17 0011 05/18/17 0528 05/18/17 1112 05/18/17 1415 05/19/17 0539  HGB 15.0  --   --  14.7  --   --   --   HCT 41.7  --   --  42.1  --   --   --   PLT 176  --   --  153  --   --   --   LABPROT  --   --   --   --   --  13.3 12.9  INR  --   --   --   --   --  1.02 0.98  HEPARINUNFRC  --   < > 0.18* 0.64 0.74*  --  0.27*  CREATININE 0.97  --   --   --   --   --   --   TROPONINI  --   --  0.06* 0.03* <0.03  --   --   < > = values in this interval not displayed.   Assessment/Plan:  66yo male subtherapeutic on heparin after resumed post-cath though likely needs more time to accumulate. Will continue gtt at current rate and check additional level.   Vernard Gambles, PharmD, BCPS  05/19/2017,6:48 AM

## 2017-05-19 NOTE — Progress Notes (Signed)
PROGRESS NOTE  Noah Walker DVV:[id number] DOB: 20-May-1951 DOA: 05/17/2017 PCP: Noah Walker., MD  HPI/Recap of past 24 hours: Noah Walker is a 66 year old Caucasian male with past medical history significant for coronary artery disease status post stenting in 2016, ongoing tobacco use and alcohol use who presented to the ED at [facility] on 05/17/17 with complaints of chest pain. Patient was admitted for unstable angina. Left heart catheterization 05/18/17.  Per cardiology, will require CABG: 95% ostial LAD stenosis, 70% distal to previously placed RCA stent.  No acute events overnight. Patient states he is not interested in quitting smoking. Denies chest pain or shortness of breath.  Exam:   General: 66 year old Caucasian male thin build sitting up in bed in no apparent acute distress alert and oriented x4  Cardiovascular: Heart is regular rate and rhythm S1-S2 noted no murmurs rubs or gallops  Respiratory: Lungs are clear to auscultation bilaterally with no wheezes or rhonchi noted  Abdomen: Soft nontender with normal bowel sounds quadrants  Musculoskeletal: No noted atrophy; moves all 4 limbs freely, strength 5 out of 5 bilaterally  Skin: No noted rashes or open lesions  Psychiatry: Mood is appropriate for condition and setting   Objective: Vitals:   05/18/17 2129 05/18/17 2249 05/19/17 0019 05/19/17 0500  BP: (!) 147/71 116/75 (!) 111/54 123/79  Pulse: (!) 55 (!) 50 (!) 51 (!) 50  Resp:    18  Temp:    97.8 F (36.6 C)  TempSrc:    Oral  SpO2:    96%  Weight:    95.8 kg (211 lb 4.8 oz)  Height:        Intake/Output Summary (Last 24 hours) at 05/19/17 0753 Last data filed at 05/19/17 0510  Gross per 24 hour  Intake           321.77 ml  Output             1325 ml  Net         -1003.23 ml   Filed Weights   05/17/17 2257 05/18/17 0814 05/19/17 0500  Weight: 95.8 kg (211 lb 1.6 oz) 95.2 kg (209 lb 14.4 oz) 95.8 kg (211 lb 4.8 oz)    Data  Reviewed: CBC:  Recent Labs Lab 05/17/17 1922 05/18/17 0528 05/19/17 0539  WBC 6.9 7.3 6.4  NEUTROABS  --   --  2.8  HGB 15.0 14.7 15.0  HCT 41.7 42.1 43.6  MCV 97.4 98.4 98.6  PLT 176 153 142*   Basic Metabolic Panel:  Recent Labs Lab 05/17/17 1922 05/19/17 0539  NA 134* 136  K 4.0 4.1  CL 107 105  CO2 22 24  GLUCOSE 98 99  BUN 12 6  CREATININE 0.97 0.89  CALCIUM 8.3* 8.7*   GFR: Estimated Creatinine Clearance: 99.4 mL/min (by C-G formula based on SCr of 0.89 mg/dL). Liver Function Tests:  Recent Labs Lab 05/19/17 0539  AST 18  ALT 18  ALKPHOS 58  BILITOT 0.6  PROT 6.2*  ALBUMIN 3.7   No results for input(s): LIPASE, AMYLASE in the last 168 hours. No results for input(s): AMMONIA in the last 168 hours. Coagulation Profile:  Recent Labs Lab 05/18/17 1415 05/19/17 0539  INR 1.02 0.98   Cardiac Enzymes:  Recent Labs Lab 05/18/17 0011 05/18/17 0528 05/18/17 1112  TROPONINI 0.06* 0.03* <0.03   BNP (last 3 results) No results for input(s): PROBNP in the last 8760 hours. HbA1C: No results for input(s): HGBA1C in the last 72  hours. CBG: No results for input(s): GLUCAP in the last 168 hours. Lipid Profile:  Recent Labs  05/19/17 0539  CHOL 192  HDL 41  LDLCALC 117*  TRIG 168*  CHOLHDL 4.7   Thyroid Function Tests:  Recent Labs  05/19/17 0539  TSH 3.229   Anemia Panel: No results for input(s): VITAMINB12, FOLATE, FERRITIN, TIBC, IRON, RETICCTPCT in the last 72 hours. Urine analysis: No results found for: COLORURINE, APPEARANCEUR, LABSPEC, PHURINE, GLUCOSEU, HGBUR, BILIRUBINUR, KETONESUR, PROTEINUR, UROBILINOGEN, NITRITE, LEUKOCYTESUR Sepsis Labs: @LABRCNTIP (procalcitonin:4,lacticidven:4)  )No results found for this or any previous visit (from the past 240 hour(s)).    Studies: No results found.  Scheduled Meds: . aspirin EC  325 mg Oral Daily  . folic acid  1 mg Oral Daily  . LORazepam  0-4 mg Intravenous Q6H   Followed by   . [START ON 05/20/2017] LORazepam  0-4 mg Intravenous Q12H  . mometasone-formoterol  2 puff Inhalation BID  . multivitamin with minerals  1 tablet Oral Daily  . nitroGLYCERIN  1 inch Topical Once  . rosuvastatin  40 mg Oral q1800  . sodium chloride flush  3 mL Intravenous Q12H  . thiamine  100 mg Intravenous Daily    Continuous Infusions: . sodium chloride    . 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 -Continue heparin drip as recommended by cardiology -Continue aspirin, rosuvastatin; nitro ointment;  -Hold beta-blocker due to bradycardia. -Echo 55-60% (05/19/17), Left heart cath 05/18/17 EF 45%  Coronary artery disease status post stenting in 2016 -Patient advised to quit smoking but declines help -Continue aspirin, rosuvastatin  Bradycardia -Persistent -Obtain EKG -cardio following, continue to hold beta blockers  COPD -PFT -Dulera 2 puffs BID  Tobacco abuse -Refuses to quit  Alcohol abuse -Alcohol withdrawal protocol in place per CIWA protocol   Code Status: Full code  Family Communication: With male partner, all questions answered to their satisfaction.  Disposition Plan: Will stay another midnight until cleared by cardiology. Plan for CABG on 05/22/17.   Consultants:  Cardiology  Procedures:  Left heart cath  05/18/17  Antimicrobials:  Not indicated   DVT prophylaxis:  Heparin drip   Noah Droparole N Neidra Girvan, MD [facility] Pager [phone]  If 7PM-7AM, please contact night-coverage [email or web address] Password [email or web address] 05/19/2017, 7:53 AM

## 2017-05-19 NOTE — Progress Notes (Signed)
  Echocardiogram 2D Echocardiogram has been performed.  Celene Skeen 05/19/2017, 9:47 AM

## 2017-05-19 NOTE — Progress Notes (Signed)
ANTICOAGULATION CONSULT NOTE - Follow Up Consult  Pharmacy Consult for heparin Indication: 2V CAD   Patient Measurements: Height: 6' (182.9 cm) Weight: 209 lb 14.4 oz (95.2 kg) IBW/kg (Calculated) : 77.6 Heparin Dosing Weight: 95.2 kg  Labs:  Recent Labs  05/17/17 1922  05/18/17 0011 05/18/17 0528 05/18/17 1112 05/18/17 1415 05/19/17 0539 05/19/17 1029  HGB 15.0  --   --  14.7  --   --  15.0  --   HCT 41.7  --   --  42.1  --   --  43.6  --   PLT 176  --   --  153  --   --  142*  --   LABPROT  --   --   --   --   --  13.3 12.9  --   INR  --   --   --   --   --  1.02 0.98  --   HEPARINUNFRC  --   < > 0.18* 0.64 0.74*  --  0.27* 0.30  CREATININE 0.97  --   --   --   --   --  0.89  --   TROPONINI  --   --  0.06* 0.03* <0.03  --   --   --   < > = values in this interval not displayed.  Assessment: 66 y/o male admitted with chest pain s/p cath which showed 2V CAD. TCTS consulted for possible CABG,  first available opening this week is Friday 10/26.  Heparin drip resumed post cath on 05/18/17.  Heparin level currently 0.3, therapeutic on heparin drip rate 1600 units/hr.  H/H wnl,  pltc slight decrease to 142k.  No bleeding noted.  Goal of Therapy:  Heparin level 0.3-0.7 units/ml   Plan:   Continue IV heparin drip rate at 1600 units/hr Daily heparin level, CBC Awaiting CABG on Friday 05/22/17.   Noah Delaine, RPh Clinical Pharmacist Pager: [phone] 8a-330p [phone] 330p-1030p phone [phone] or [phone] Main pharmacy [phone] 05/19/2017 12:42 PM

## 2017-05-20 ENCOUNTER — Other Ambulatory Visit (HOSPITAL_COMMUNITY): Payer: Medicare Other

## 2017-05-20 DIAGNOSIS — I252 Old myocardial infarction: Secondary | ICD-10-CM

## 2017-05-20 LAB — COMPREHENSIVE METABOLIC PANEL
ALBUMIN: 3.6 g/dL (ref 3.5–5.0)
ALK PHOS: 59 U/L (ref 38–126)
ALT: 20 U/L (ref 17–63)
ANION GAP: 8 (ref 5–15)
AST: 17 U/L (ref 15–41)
BILIRUBIN TOTAL: 0.8 mg/dL (ref 0.3–1.2)
BUN: 7 mg/dL (ref 6–20)
CALCIUM: 8.9 mg/dL (ref 8.9–10.3)
CO2: 25 mmol/L (ref 22–32)
Chloride: 105 mmol/L (ref 101–111)
Creatinine, Ser: 0.95 mg/dL (ref 0.61–1.24)
GFR calc Af Amer: >60 mL/min (ref >60)
GLUCOSE: 97 mg/dL (ref 65–99)
POTASSIUM: 3.6 mmol/L (ref 3.5–5.1)
Sodium: 138 mmol/L (ref 135–145)
TOTAL PROTEIN: 5.9 g/dL — AB (ref 6.5–8.1)

## 2017-05-20 LAB — CBC
HEMATOCRIT: 42.5 % (ref 39.0–52.0)
Hemoglobin: 14.7 g/dL (ref 13.0–17.0)
MCH: 34.1 pg — AB (ref 26.0–34.0)
MCHC: 34.6 g/dL (ref 30.0–36.0)
MCV: 98.6 fL (ref 78.0–100.0)
PLATELETS: 132 10*3/uL — AB (ref 150–400)
RBC: 4.31 MIL/uL (ref 4.22–5.81)
RDW: 12.5 % (ref 11.5–15.5)
WBC: 6.3 10*3/uL (ref 4.0–10.5)

## 2017-05-20 LAB — TSH: TSH: 3.664 u[IU]/mL (ref 0.350–4.500)

## 2017-05-20 LAB — HEPARIN LEVEL (UNFRACTIONATED): Heparin Unfractionated: 0.39 IU/mL (ref 0.30–0.70)

## 2017-05-20 LAB — HEMOGLOBIN A1C
Hgb A1c MFr Bld: 4.9 % (ref 4.8–5.6)
Mean Plasma Glucose: 94 mg/dL

## 2017-05-20 MED ORDER — ZOLPIDEM TARTRATE 5 MG PO TABS
5.0000 mg | ORAL_TABLET | Freq: Once | ORAL | Status: AC
  Administered 2017-05-20: 5 mg via ORAL
  Filled 2017-05-20: qty 1

## 2017-05-20 NOTE — Progress Notes (Signed)
Patient requesting something for sleep. He stated that MD today encouraged him to sleep and get good rest. No PRN ordered. Paged MD on call.  Verbal order with read back Ambien 5 mg per MD Toniann Fail. Will continue to monitor.  Rashid Whitenight, RN

## 2017-05-20 NOTE — Progress Notes (Signed)
ANTICOAGULATION CONSULT NOTE - Follow Up Consult  Pharmacy Consult for heparin Indication: 2V CAD   Patient Measurements: Height: 6' (182.9 cm) Weight: 209 lb 14.4 oz (95.2 kg) IBW/kg (Calculated) : 77.6 Heparin Dosing Weight: 95.2 kg  Labs:  Recent Labs  05/17/17 1922  05/18/17 0011 05/18/17 0528 05/18/17 1112 05/18/17 1415 05/19/17 0539 05/19/17 1029 05/20/17 0509  HGB 15.0  --   --  14.7  --   --  15.0  --  14.7  HCT 41.7  --   --  42.1  --   --  43.6  --  42.5  PLT 176  --   --  153  --   --  142*  --  132*  LABPROT  --   --   --   --   --  13.3 12.9  --   --   INR  --   --   --   --   --  1.02 0.98  --   --   HEPARINUNFRC  --   < > 0.18* 0.64 0.74*  --  0.27* 0.30 0.39  CREATININE 0.97  --   --   --   --   --  0.89  --  0.95  TROPONINI  --   --  0.06* 0.03* <0.03  --   --   --   --   < > = values in this interval not displayed.  Assessment: 66 y/o male admitted with chest pain s/p cath which showed 2V CAD, severe LAD disease.  TCTS plans for CABG on Friday 10/26.  Heparin drip resumed post cath on 05/18/17.  Heparin level currently 0.39, remains therapeutic on heparin drip rate 1600 units/hr.  H/H wnl/stable and PLTC  decrease to 132k from 176k on admit.  No bleeding noted.  Goal of Therapy:  Heparin level 0.3-0.7 units/ml   Plan:   Continue IV heparin drip rate at 1600 units/hr Daily heparin level, CBC Awaiting CABG on Friday 05/22/17.  Thank you for allowing pharmacy to be part of this patients care team. Noah Delaine, RPh Clinical Pharmacist Pager: [phone] 8a-330p [phone] 330p-1030p phone [phone] or [phone] Main pharmacy [phone] 05/20/2017 11:24 AM

## 2017-05-20 NOTE — Progress Notes (Signed)
PT Cancellation Note and D/C  Patient Details Name: Noah Walker MRN: [id number] DOB: Dec 09, 1950   Cancelled Treatment:    Reason Eval/Treat Not Completed: PT screened, no needs identified, will sign off (Pt in room and halls ambulating independently. Reorder post surgery. )   Noah Walker 05/20/2017, 10:30 AM [facility] [phone] (pager)

## 2017-05-20 NOTE — Progress Notes (Signed)
Progress Note  Patient Name: Noah Walker Date of Encounter: 05/20/2017  Primary Cardiologist: Dr. Karin Golden (HP)  Subjective   Feeling well, using fake cigarette. No chest pain.    Inpatient Medications    Scheduled Meds: . aspirin EC  325 mg Oral Daily  . folic acid  1 mg Oral Daily  . LORazepam  0-4 mg Intravenous Q12H  . mometasone-formoterol  2 puff Inhalation BID  . multivitamin with minerals  1 tablet Oral Daily  . nicotine  21 mg Transdermal Daily  . nitroGLYCERIN  1 inch Topical Once  . rosuvastatin  40 mg Oral q1800  . sodium chloride flush  3 mL Intravenous Q12H  . thiamine  100 mg Oral Daily   Continuous Infusions: . sodium chloride    . heparin 1,600 Units/hr (05/20/17 0247)   PRN Meds: sodium chloride, acetaminophen, LORazepam **OR** LORazepam, nitroGLYCERIN, ondansetron (ZOFRAN) IV, sodium chloride flush   Vital Signs    Vitals:   05/19/17 1931 05/19/17 1934 05/20/17 0530 05/20/17 0945  BP: (!) 147/82  128/87   Pulse: 61  (!) 49   Resp: 18  18   Temp: (!) 97.5 F (36.4 C)  (!) 97.5 F (36.4 C)   TempSrc: Oral  Oral   SpO2: 98% 97% 98% 96%  Weight:   210 lb 3.2 oz (95.3 kg)   Height:        Intake/Output Summary (Last 24 hours) at 05/20/17 1100 Last data filed at 05/20/17 0300  Gross per 24 hour  Intake             1264 ml  Output             1620 ml  Net             -356 ml   Filed Weights   05/18/17 0814 05/19/17 0500 05/20/17 0530  Weight: 209 lb 14.4 oz (95.2 kg) 211 lb 4.8 oz (95.8 kg) 210 lb 3.2 oz (95.3 kg)    Telemetry    SB - Personally Reviewed  Physical Exam   General: Well developed, well nourished, male appearing in no acute distress. Head: Normocephalic, atraumatic.  Neck: Supple without bruits, JVD. Lungs:  Resp regular and unlabored, CTA. Heart: RRR, S1, S2, no S3, S4, or murmur; no rub. Abdomen: Soft, non-tender, non-distended with normoactive bowel sounds. No hepatomegaly. No rebound/guarding. No obvious abdominal  masses. Extremities: No clubbing, cyanosis, edema. Distal pedal pulses are 2+ bilaterally. Neuro: Alert and oriented X 3. Moves all extremities spontaneously. Psych: Normal affect.  Labs    Chemistry Recent Labs Lab 05/17/17 1922 05/19/17 0539 05/20/17 0509  NA 134* 136 138  K 4.0 4.1 3.6  CL 107 105 105  CO2 22 24 25   GLUCOSE 98 99 97  BUN 12 6 7   CREATININE 0.97 0.89 0.95  CALCIUM 8.3* 8.7* 8.9  PROT  --  6.2* 5.9*  ALBUMIN  --  3.7 3.6  AST  --  18 17  ALT  --  18 20  ALKPHOS  --  58 59  BILITOT  --  0.6 0.8  GFRNONAA >60 >60 >60  GFRAA >60 >60 >60  ANIONGAP 5 7 8      Hematology Recent Labs Lab 05/18/17 0528 05/19/17 0539 05/20/17 0509  WBC 7.3 6.4 6.3  RBC 4.28 4.42 4.31  HGB 14.7 15.0 14.7  HCT 42.1 43.6 42.5  MCV 98.4 98.6 98.6  MCH 34.3* 33.9 34.1*  MCHC 34.9 34.4 34.6  RDW  12.7 12.7 12.5  PLT 153 142* 132*    Cardiac Enzymes Recent Labs Lab 05/18/17 0011 05/18/17 0528 05/18/17 1112  TROPONINI 0.06* 0.03* <0.03    Recent Labs Lab 05/17/17 1944  TROPIPOC 0.01     BNPNo results for input(s): BNP, PROBNP in the last 168 hours.   DDimer No results for input(s): DDIMER in the last 168 hours.    Radiology    No results found.  Cardiac Studies   Cath: 05/18/17  LEFT HEART CATH AND CORONARY ANGIOGRAPHY - 05/18/2017    Prox RCA to Mid RCA stent, 0 %stenosed.  Mid RCA lesion, 20 %stenosed.  Dist LAD lesion, 90 %stenosed.  Prox RCA lesion, 70 %stenosed.  Prox LAD to Mid LAD lesion, 95 %stenosed.  Prox Cx to Mid Cx lesion, 30 %stenosed.  Findings: 1. Significant 2v CAD with ulcerated 95% ostial LAD lesion and hazy 60-70% mid RCA lesion prior to stent 2. EF 45% with apical and inferoapical HK        Patient Profile     66 y.o. male with PMH CAD s/p prior PCI who presented on October 21 with chest pain. He was initially seen by Dr. Cristal Deer in consult at the request of Dr. Adela Lank for assistance with management.  He  reported that he was in his usual state of health until evening of 10/21. He had been at home most of the day, but when he went to get into his truck he felt a rapid onset heavy/dull discomfort across his chest that radiated to his left shoulder. This was similar to how he felt during his previous MI. He also felt short of breath and diaphoretic at the time. His symptoms were resolved with NG, and EMS brought him to the ER for further evaluation.   Symptoms were concerning for unstable angina he was therefore referred for cardiac catheterization which revealed severe LAD and RCA disease. CT surgical consult was recommended.  Assessment & Plan    1. Unstable Angina: underwent cath on 10/22 showing severe LAD disease, along with with lesion in the RCA prox to previously placed stent. TCTS consulted, seen by Dr. Donata Clay with plans for CABG 10/26. Remains on IV heparin -- asa, statin, nitropaste. No BB 2/2 bradycardia  2. HL: on statin  3. Tobacco use: using nicotine patch  4. ETOH use: on CIWA protocol    Signed, Laverda Page, NP  05/20/2017, 11:00 AM  Pager # [phone]   I have seen, examined and evaluated the patient this PM along with Laverda Page, NP-C.  After reviewing all the available data and chart, we discussed the patients laboratory, study & physical findings as well as symptoms in detail. I agree with her findings, examination as well as impression recommendations as per our discussion.    Attending adjustments noted in italics.   Currently no active anginal symptoms. Maintaining current regimen with planned CABG on Friday. He is asking about scheduled time. I unfortunately cannot answer that question.  He is not showing any signs of alcohol withdrawal. He is enjoying his fake cigarette.   Bryan Lemma, M.D., M.S. Interventional Cardiologist   Pager # [phone] Phone # [phone] [address] [place], [place] [address]     For questions or  updates, please contact [facility] Please consult [email or web address] for contact info under Cardiology/STEMI. Daytime calls, contact the Day Call APP (6a-8a) or assigned team (Teams A-D) provider (7:30a - 5p). All other daytime calls (7:30-5p), contact the Card Master @  [phone].   Nighttime calls, contact the assigned APP (5p-8p) or MD (6:30p-8p). Overnight calls (8p-6a), contact the on call Fellow @ [phone].

## 2017-05-20 NOTE — Progress Notes (Addendum)
PROGRESS NOTE    Noah Walker  ZOX:[id number] DOB: [birth year] DOA: 05/17/2017 PCP: Titus DubinFutrell, Thomas M., MD    Brief Narrative: Noah Walker is a 66 year old Caucasian male with past medical history significant for coronary artery disease status post stenting in 2016,ongoing tobacco use and alcohol usewho [facility] with complaints of chest pain. Patient was admitted for unstable angina.Left heart catheterization 05/18/17.  Per cardiology, will require CABG: 95% ostial LAD stenosis, 70% distal to previously placed RCA stent.      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 Cath with 3 vessel diseases. For CABG on Friday.  Left heart catheterization 05/18/17 On heparin gtt.  Continue with statin, aspirin.  Cardiology will take over patient care. Please call us as needed.   Bradycardia; asymptomatic.   COPD;  Nebulizer.   Alcohol abuse -Alcohol withdrawal protocol in place per CIWA protocol  DVT prophylaxis: Heparin.  Code Status: Full code.  Family Communication: care discussed with patient  Disposition Plan: Sx Friday    Consultants:   Cardiology   CVTS    Procedures: none   Antimicrobials:   none   Subjective: Denies chest pain   Objective: Vitals:   05/19/17 1934 05/20/17 0530 05/20/17 0945 05/20/17 1127  BP:  128/87  126/84  Pulse:  (!) 49  65  Resp:  18    Temp:  (!) 97.5 F (36.4 C)    TempSrc:  Oral    SpO2: 97% 98% 96% 96%  Weight:  95.3 kg (210 lb 3.2 oz)    Height:        Intake/Output Summary (Last 24 hours) at 05/20/17 1502 Last data filed at 05/20/17 1344  Gross per 24 hour  Intake             1184 ml  Output              1300 ml  Net             -116 ml   Filed Weights   05/18/17 0814 05/19/17 0500 05/20/17 0530  Weight: 95.2 kg (209 lb 14.4 oz) 95.8 kg (211 lb 4.8 oz) 95.3 kg (210 lb 3.2 oz)    Examination:  General exam: Appears calm and comfortable  Respiratory system: Clear to auscultation. Respiratory effort normal. Cardiovascular system: S1 & S2 heard, RRR. No JVD, murmurs, rubs, gallops or clicks. No pedal edema. Gastrointestinal system: Abdomen is nondistended, soft and nontender. No organomegaly or masses felt. Normal bowel sounds heard. Central nervous system: Alert and oriented. No focal neurological deficits. Extremities: Symmetric 5 x 5 power. Skin: No rashes, lesions or ulcers   Data Reviewed: I have personally reviewed following labs and imaging studies  CBC:  Recent Labs Lab 05/17/17 1922 05/18/17 0528 05/19/17 0539 05/20/17 0509  WBC 6.9 7.3 6.4 6.3  NEUTROABS  --   --  2.8  --   HGB 15.0 14.7 15.0 14.7  HCT 41.7 42.1 43.6 42.5  MCV 97.4 98.4 98.6 98.6  PLT 176 153 142* 132*   Basic Metabolic Panel:  Recent Labs Lab 05/17/17 1922 05/19/17 0539 05/20/17 0509  NA 134* 136 138  K 4.0  4.1 3.6  CL 107 105 105  CO2 22 24 25   GLUCOSE 98 99 97  BUN 12 6 7   CREATININE 0.97 0.89 0.95  CALCIUM 8.3* 8.7* 8.9   GFR: Estimated Creatinine Clearance: 92.9 mL/min (by C-G formula based on SCr of 0.95 mg/dL). Liver Function Tests:  Recent Labs Lab 05/19/17 0539 05/20/17 0509  AST 18 17  ALT 18 20  ALKPHOS 58 59  BILITOT 0.6 0.8  PROT 6.2* 5.9*  ALBUMIN 3.7 3.6   No results for input(s): LIPASE, AMYLASE in the last 168 hours. No results for input(s): AMMONIA in the last 168 hours. Coagulation Profile:  Recent Labs Lab 05/18/17 1415 05/19/17 0539  INR 1.02 0.98   Cardiac Enzymes:  Recent Labs Lab 05/18/17 0011 05/18/17 0528 05/18/17 1112  TROPONINI 0.06* 0.03* <0.03   BNP (last 3 results) No results for input(s): PROBNP in the last 8760  hours. HbA1C:  Recent Labs  05/19/17 0539  HGBA1C 4.9   CBG: No results for input(s): GLUCAP in the last 168 hours. Lipid Profile:  Recent Labs  05/19/17 0539  CHOL 192  HDL 41  LDLCALC 117*  TRIG 168*  CHOLHDL 4.7   Thyroid Function Tests:  Recent Labs  05/20/17 0509  TSH 3.664   Anemia Panel: No results for input(s): VITAMINB12, FOLATE, FERRITIN, TIBC, IRON, RETICCTPCT in the last 72 hours. Sepsis Labs: No results for input(s): PROCALCITON, LATICACIDVEN in the last 168 hours.  Recent Results (from the past 240 hour(s))  Surgical pcr screen     Status: None   Collection Time: 05/19/17  6:11 AM  Result Value Ref Range Status   MRSA, PCR NEGATIVE NEGATIVE Final   Staphylococcus aureus NEGATIVE NEGATIVE Final    Comment: (NOTE) The Xpert SA Assay (FDA approved for NASAL specimens in patients 72 years of age and older), is one component of a comprehensive surveillance program. It is not intended to diagnose infection nor to guide or monitor treatment.          Radiology Studies: No results found.      Scheduled Meds: . aspirin EC  325 mg Oral Daily  . folic acid  1 mg Oral Daily  . LORazepam  0-4 mg Intravenous Q12H  . mometasone-formoterol  2 puff Inhalation BID  . multivitamin with minerals  1 tablet Oral Daily  . nicotine  21 mg Transdermal Daily  . nitroGLYCERIN  1 inch Topical Once  . rosuvastatin  40 mg Oral q1800  . sodium chloride flush  3 mL Intravenous Q12H  . thiamine  100 mg Oral Daily   Continuous Infusions: . sodium chloride    . heparin 1,600 Units/hr (05/20/17 0247)     LOS: 2 days    Time spent: 35 minutes.     Alba Cory, MD [facility] Pager [phone]  If 7PM-7AM, please contact night-coverage [email or web address] Password [email or web address] 05/20/2017, 3:02 PM

## 2017-05-21 ENCOUNTER — Inpatient Hospital Stay (HOSPITAL_COMMUNITY): Payer: Medicare Other

## 2017-05-21 DIAGNOSIS — Z01810 Encounter for preprocedural cardiovascular examination: Secondary | ICD-10-CM

## 2017-05-21 LAB — CBC
HCT: 42.9 % (ref 39.0–52.0)
HEMATOCRIT: 42.3 % (ref 39.0–52.0)
HEMOGLOBIN: 14.6 g/dL (ref 13.0–17.0)
HEMOGLOBIN: 14.9 g/dL (ref 13.0–17.0)
MCH: 34.1 pg — AB (ref 26.0–34.0)
MCH: 34.4 pg — ABNORMAL HIGH (ref 26.0–34.0)
MCHC: 34.5 g/dL (ref 30.0–36.0)
MCHC: 34.7 g/dL (ref 30.0–36.0)
MCV: 98.8 fL (ref 78.0–100.0)
MCV: 99.1 fL (ref 78.0–100.0)
PLATELETS: 140 10*3/uL — AB (ref 150–400)
Platelets: 136 10*3/uL — ABNORMAL LOW (ref 150–400)
RBC: 4.28 MIL/uL (ref 4.22–5.81)
RBC: 4.33 MIL/uL (ref 4.22–5.81)
RDW: 12.5 % (ref 11.5–15.5)
RDW: 12.5 % (ref 11.5–15.5)
WBC: 5.6 10*3/uL (ref 4.0–10.5)
WBC: 7.1 10*3/uL (ref 4.0–10.5)

## 2017-05-21 LAB — BASIC METABOLIC PANEL
Anion gap: 9 (ref 5–15)
BUN: 9 mg/dL (ref 6–20)
CO2: 23 mmol/L (ref 22–32)
Calcium: 9.2 mg/dL (ref 8.9–10.3)
Chloride: 104 mmol/L (ref 101–111)
Creatinine, Ser: 0.98 mg/dL (ref 0.61–1.24)
GFR calc Af Amer: >60 mL/min (ref >60)
GFR calc non Af Amer: >60 mL/min (ref >60)
Glucose, Bld: 105 mg/dL — ABNORMAL HIGH (ref 65–99)
Potassium: 3.9 mmol/L (ref 3.5–5.1)
Sodium: 136 mmol/L (ref 135–145)

## 2017-05-21 LAB — PULMONARY FUNCTION TEST
DL/VA % pred: 82 %
DL/VA: 3.87 ml/min/mmHg/L
DLCO cor % pred: 78 %
DLCO cor: 27.66 ml/min/mmHg
DLCO unc % pred: 78 %
DLCO unc: 27.66 ml/min/mmHg
FEF 25-75 Post: 4.15 L/sec
FEF 25-75 Pre: 3.05 L/sec
FEF2575-%Change-Post: 36 %
FEF2575-%Pred-Post: 143 %
FEF2575-%Pred-Pre: 105 %
FEV1-%Change-Post: 7 %
FEV1-%Pred-Post: 97 %
FEV1-%Pred-Pre: 90 %
FEV1-Post: 3.57 L
FEV1-Pre: 3.33 L
FEV1FVC-%Change-Post: -1 %
FEV1FVC-%Pred-Pre: 105 %
FEV6-%Change-Post: 7 %
FEV6-%Pred-Post: 96 %
FEV6-%Pred-Pre: 89 %
FEV6-Post: 4.51 L
FEV6-Pre: 4.19 L
FEV6FVC-%Change-Post: 0 %
FEV6FVC-%Pred-Post: 103 %
FEV6FVC-%Pred-Pre: 104 %
FVC-%Change-Post: 8 %
FVC-%Pred-Post: 92 %
FVC-%Pred-Pre: 85 %
FVC-Post: 4.57 L
FVC-Pre: 4.21 L
Post FEV1/FVC ratio: 78 %
Post FEV6/FVC ratio: 99 %
Pre FEV1/FVC ratio: 79 %
Pre FEV6/FVC Ratio: 99 %
RV % pred: 161 %
RV: 3.96 L
TLC % pred: 116 %
TLC: 8.64 L

## 2017-05-21 LAB — BLOOD GAS, ARTERIAL
Acid-Base Excess: 1.1 mmol/L (ref 0.0–2.0)
Bicarbonate: 25.1 mmol/L (ref 20.0–28.0)
Drawn by: 213381
FIO2: 21
O2 Saturation: 93.7 %
Patient temperature: 98.6
pCO2 arterial: 39.6 mmHg (ref 32.0–48.0)
pH, Arterial: 7.419 (ref 7.350–7.450)
pO2, Arterial: 68.3 mmHg — ABNORMAL LOW (ref 83.0–108.0)

## 2017-05-21 LAB — ABO/RH: ABO/RH(D): O NEG

## 2017-05-21 LAB — PREPARE RBC (CROSSMATCH)

## 2017-05-21 LAB — HEPARIN LEVEL (UNFRACTIONATED): Heparin Unfractionated: 0.42 IU/mL (ref 0.30–0.70)

## 2017-05-21 MED ORDER — MAGNESIUM SULFATE 50 % IJ SOLN
40.0000 meq | INTRAMUSCULAR | Status: DC
  Filled 2017-05-21: qty 9.85

## 2017-05-21 MED ORDER — NITROGLYCERIN IN D5W 200-5 MCG/ML-% IV SOLN: 2.0000 ug/min | INTRAVENOUS | Status: DC

## 2017-05-21 MED ORDER — DEXMEDETOMIDINE HCL IN NACL 400 MCG/100ML IV SOLN
0.1000 ug/kg/h | INTRAVENOUS | Status: AC
  Administered 2017-05-22: 0.7 ug/kg/h via INTRAVENOUS
  Filled 2017-05-21 (×2): qty 100

## 2017-05-21 MED ORDER — ALBUTEROL SULFATE (2.5 MG/3ML) 0.083% IN NEBU
2.5000 mg | INHALATION_SOLUTION | Freq: Once | RESPIRATORY_TRACT | Status: AC
  Administered 2017-05-21: 2.5 mg via RESPIRATORY_TRACT

## 2017-05-21 MED ORDER — DEXTROSE 5 % IV SOLN
1.5000 g | INTRAVENOUS | Status: AC
  Administered 2017-05-22: .75 g via INTRAVENOUS
  Administered 2017-05-22: 1.5 g via INTRAVENOUS
  Filled 2017-05-21 (×2): qty 1.5

## 2017-05-21 MED ORDER — SODIUM CHLORIDE 0.9 % IV SOLN: 1500.0000 mg | INTRAVENOUS | Status: DC

## 2017-05-21 MED ORDER — METOPROLOL TARTRATE 12.5 MG HALF TABLET
12.5000 mg | ORAL_TABLET | Freq: Once | ORAL | Status: DC
  Filled 2017-05-21: qty 1

## 2017-05-21 MED ORDER — SODIUM CHLORIDE 0.9 % IV SOLN: INTRAVENOUS | Status: DC

## 2017-05-21 MED ORDER — DEXTROSE 5 % IV SOLN: 750.0000 mg | INTRAVENOUS | Status: DC

## 2017-05-21 MED ORDER — SODIUM CHLORIDE 0.9 % IV SOLN: 30.0000 ug/min | INTRAVENOUS | Status: DC

## 2017-05-21 MED ORDER — EPINEPHRINE PF 1 MG/ML IJ SOLN
0.0000 ug/min | INTRAVENOUS | Status: DC
  Filled 2017-05-21: qty 4

## 2017-05-21 MED ORDER — DOPAMINE-DEXTROSE 3.2-5 MG/ML-% IV SOLN: 0.0000 ug/kg/min | INTRAVENOUS | Status: DC

## 2017-05-21 MED ORDER — TRANEXAMIC ACID (OHS) BOLUS VIA INFUSION: 15.0000 mg/kg | INTRAVENOUS | Status: DC

## 2017-05-21 MED ORDER — SODIUM CHLORIDE 0.9 % IV SOLN: 1.5000 mg/kg/h | INTRAVENOUS | Status: DC

## 2017-05-21 MED ORDER — NITROGLYCERIN IN D5W 200-5 MCG/ML-% IV SOLN
2.0000 ug/min | INTRAVENOUS | Status: DC
Start: 2017-05-22 — End: 2017-05-22
  Filled 2017-05-21: qty 250

## 2017-05-21 MED ORDER — POTASSIUM CHLORIDE 2 MEQ/ML IV SOLN
80.0000 meq | INTRAVENOUS | Status: DC
  Filled 2017-05-21: qty 40

## 2017-05-21 MED ORDER — DOPAMINE-DEXTROSE 3.2-5 MG/ML-% IV SOLN
0.0000 ug/kg/min | INTRAVENOUS | Status: DC
  Filled 2017-05-21: qty 250

## 2017-05-21 MED ORDER — SODIUM CHLORIDE 0.9 % IV SOLN
INTRAVENOUS | Status: AC
  Administered 2017-05-22: .7 [IU]/h via INTRAVENOUS
  Filled 2017-05-21: qty 1

## 2017-05-21 MED ORDER — TEMAZEPAM 15 MG PO CAPS: 15.0000 mg | ORAL_CAPSULE | Freq: Once | ORAL | Status: DC | PRN

## 2017-05-21 MED ORDER — CEFUROXIME SODIUM 1.5 G IV SOLR: 1.5000 g | INTRAVENOUS | Status: DC

## 2017-05-21 MED ORDER — TRANEXAMIC ACID (OHS) BOLUS VIA INFUSION
15.0000 mg/kg | INTRAVENOUS | Status: AC
  Administered 2017-05-22: 1434 mg via INTRAVENOUS
  Filled 2017-05-21: qty 1434

## 2017-05-21 MED ORDER — CHLORHEXIDINE GLUCONATE 4 % EX LIQD
60.0000 mL | Freq: Once | CUTANEOUS | Status: AC
  Administered 2017-05-21: 4 via TOPICAL

## 2017-05-21 MED ORDER — PAPAVERINE HCL 30 MG/ML IJ SOLN: INTRAMUSCULAR | Status: DC

## 2017-05-21 MED ORDER — POTASSIUM CHLORIDE 2 MEQ/ML IV SOLN: 80.0000 meq | INTRAVENOUS | Status: DC

## 2017-05-21 MED ORDER — BISACODYL 5 MG PO TBEC
5.0000 mg | DELAYED_RELEASE_TABLET | Freq: Once | ORAL | Status: AC
  Administered 2017-05-21: 5 mg via ORAL
  Filled 2017-05-21: qty 1

## 2017-05-21 MED ORDER — MAGNESIUM SULFATE 50 % IJ SOLN: 40.0000 meq | INTRAMUSCULAR | Status: DC

## 2017-05-21 MED ORDER — DIAZEPAM 5 MG PO TABS
5.0000 mg | ORAL_TABLET | Freq: Once | ORAL | Status: AC
  Administered 2017-05-22: 5 mg via ORAL
  Filled 2017-05-21: qty 1

## 2017-05-21 MED ORDER — TRANEXAMIC ACID (OHS) PUMP PRIME SOLUTION
2.0000 mg/kg | INTRAVENOUS | Status: DC
  Filled 2017-05-21: qty 1.91

## 2017-05-21 MED ORDER — PHENYLEPHRINE HCL 10 MG/ML IJ SOLN
30.0000 ug/min | INTRAMUSCULAR | Status: AC
  Administered 2017-05-22: 25 ug/min via INTRAVENOUS
  Filled 2017-05-21: qty 2

## 2017-05-21 MED ORDER — VANCOMYCIN HCL 10 G IV SOLR
1500.0000 mg | INTRAVENOUS | Status: AC
  Administered 2017-05-22: 1500 mg via INTRAVENOUS
  Filled 2017-05-21: qty 1500

## 2017-05-21 MED ORDER — TRANEXAMIC ACID (OHS) PUMP PRIME SOLUTION
2.0000 mg/kg | INTRAVENOUS | Status: DC
Start: 2017-05-22 — End: 2017-05-21

## 2017-05-21 MED ORDER — CHLORHEXIDINE GLUCONATE 0.12 % MT SOLN: 15.0000 mL | Freq: Once | OROMUCOSAL | Status: DC

## 2017-05-21 MED ORDER — CHLORHEXIDINE GLUCONATE 4 % EX LIQD
60.0000 mL | Freq: Once | CUTANEOUS | Status: DC
  Filled 2017-05-21: qty 60

## 2017-05-21 MED ORDER — SODIUM CHLORIDE 0.9 % IV SOLN
INTRAVENOUS | Status: DC
  Filled 2017-05-21: qty 30

## 2017-05-21 MED ORDER — TRANEXAMIC ACID 1000 MG/10ML IV SOLN
1.5000 mg/kg/h | INTRAVENOUS | Status: AC
  Administered 2017-05-22: 1.5 mg/kg/h via INTRAVENOUS
  Filled 2017-05-21: qty 25

## 2017-05-21 MED ORDER — DEXMEDETOMIDINE HCL IN NACL 400 MCG/100ML IV SOLN
0.1000 ug/kg/h | INTRAVENOUS | Status: DC
  Filled 2017-05-21: qty 100

## 2017-05-21 MED ORDER — ALPRAZOLAM 0.25 MG PO TABS: 0.2500 mg | ORAL_TABLET | ORAL | Status: DC | PRN

## 2017-05-21 MED ORDER — PLASMA-LYTE 148 IV SOLN
INTRAVENOUS | Status: AC
  Administered 2017-05-22: 16:00:00
  Filled 2017-05-21 (×2): qty 2.5

## 2017-05-21 MED ORDER — EPINEPHRINE PF 1 MG/ML IJ SOLN: 0.0000 ug/min | INTRAVENOUS | Status: DC

## 2017-05-21 MED ORDER — DEXTROSE 5 % IV SOLN
750.0000 mg | INTRAVENOUS | Status: DC
  Filled 2017-05-21: qty 750

## 2017-05-21 NOTE — Progress Notes (Signed)
ANTICOAGULATION CONSULT NOTE - Follow Up Consult  Pharmacy Consult for heparin Indication: 2V CAD   Patient Measurements: Height: 6' (182.9 cm) Weight: 209 lb 14.4 oz (95.2 kg) IBW/kg (Calculated) : 77.6 Heparin Dosing Weight: 95.2 kg  Labs:  Recent Labs  05/18/17 1112 05/18/17 1415  05/19/17 0539 05/19/17 1029 05/20/17 0509 05/21/17 0252  HGB  --   --   < > 15.0  --  14.7 14.6  HCT  --   --   --  43.6  --  42.5 42.3  PLT  --   --   --  142*  --  132* 140*  LABPROT  --  13.3  --  12.9  --   --   --   INR  --  1.02  --  0.98  --   --   --   HEPARINUNFRC 0.74*  --   --  0.27* 0.30 0.39 0.42  CREATININE  --   --   --  0.89  --  0.95  --   TROPONINI <0.03  --   --   --   --   --   --   < > = values in this interval not displayed.  Assessment: 66 y/o male admitted with chest pain s/p cath which showed 2V CAD, severe LAD disease.  TCTS plans for CABG on Friday 10/26.  Heparin drip resumed post cath on 05/18/17.   Heparin level currently 0.42, remains therapeutic on heparin drip rate 1600 units/hr.  H/H wnl/stable and PLTC  Improved to 140k , low/stable today.  No bleeding noted.  Goal of Therapy:  Heparin level 0.3-0.7 units/ml   Plan:   Continue IV heparin drip rate at 1600 units/hr Daily heparin level, CBC Awaiting CABG on Friday 05/22/17.- heart pack orders entered.   Thank you for allowing pharmacy to be part of this patients care team. Noah Delaine, RPh Clinical Pharmacist Pager: [phone] 8a-330p [phone] 330p-1030p phone [phone] or [phone] Main pharmacy [phone] 05/21/2017 10:10 AM

## 2017-05-21 NOTE — Progress Notes (Signed)
3 Days Post-Op Procedure(s) (LRB): LEFT HEART CATH AND CORONARY ANGIOGRAPHY (N/A) Subjective: Patient stable ambulating the hallways Plan CABG 2 October 26 Preoperative Doppler showed no significant carotid disease PFTs satisfactory for sternotomy  Objective: Vital signs in last 24 hours: Temp:  [97.6 F (36.4 C)-98 F (36.7 C)] 97.6 F (36.4 C) (10/25 1153) Pulse Rate:  [55-62] 60 (10/25 1153) Cardiac Rhythm: Normal sinus rhythm (10/25 0700) Resp:  [18-20] 20 (10/25 1153) BP: (131-142)/(62-80) 142/73 (10/25 1153) SpO2:  [94 %-100 %] 100 % (10/25 1153) Weight:  [210 lb 11.2 oz (95.6 kg)] 210 lb 11.2 oz (95.6 kg) (10/25 8588)  Hemodynamic parameters for last 24 hours:  sinus rhythm  Intake/Output from previous day: 10/24 0701 - 10/25 0700 In: 801.7 [P.O.:360; I.V.:441.7] Out: 950 [Urine:950] Intake/Output this shift: Total I/O In: 483 [P.O.:480; I.V.:3] Out: -        Exam    General- alert and comfortable   Lungs- clear without rales, wheezes   Cor- regular rate and rhythm, no murmur , gallop   Abdomen- soft, non-tender   Extremities - warm, non-tender, minimal edema   Neuro- oriented, appropriate, no focal weakness   Lab Results:  Recent Labs  05/20/17 0509 05/21/17 0252  WBC 6.3 5.6  HGB 14.7 14.6  HCT 42.5 42.3  PLT 132* 140*   BMET:  Recent Labs  05/19/17 0539 05/20/17 0509  NA 136 138  K 4.1 3.6  CL 105 105  CO2 24 25  GLUCOSE 99 97  BUN 6 7  CREATININE 0.89 0.95  CALCIUM 8.7* 8.9    PT/INR:  Recent Labs  05/19/17 0539  LABPROT 12.9  INR 0.98   ABG    Component Value Date/Time   PHART 7.419 05/21/2017 1145   HCO3 25.1 05/21/2017 1145   TCO2 27 02/07/2015 1645   O2SAT 93.7 05/21/2017 1145   CBG (last 3)  No results for input(s): GLUCAP in the last 72 hours.  Assessment/Plan: S/P Procedure(s) (LRB): LEFT HEART CATH AND CORONARY ANGIOGRAPHY (N/A) I discussed the procedure of CABG with the patient including the location the  surgical incision, use of general anesthesia and cardio point bypass, and the expected postoperative recovery. I discussed the potential risks of stroke bleeding infection pulmonary problems, organ failure, and death. He understands and agrees to proceed with surgery tomorrow.   LOS: 3 days    Kathlee Nations Trigt III 05/21/2017

## 2017-05-21 NOTE — Progress Notes (Signed)
Patient with no complaints or concerns during 7pm - 7am shift. Slept during the night.   Yanisa Goodgame, RN 

## 2017-05-21 NOTE — Progress Notes (Signed)
CARDIAC REHAB PHASE I   PRE:  Rate/Rhythm: 63 SR  BP:  Sitting: 133/85        SaO2: 96 RA  MODE:  Ambulation: 1070 ft   POST:  Rate/Rhythm: 63 SR  BP:  Sitting: 147/78          SaO2: 98 RA  Pt ambulated 1070 ft on RA, IV, independent, steady gait, tolerated well with no complaints. Reinforced pre-op education, encouraged IS, emphasized need for 24 hr supervision upon discharge. Pt verbalized understanding. Pt to bed for ABG draw after walk, call bell within reach. Will follow post-op.   [phone] Joylene Grapes, RN, BSN 05/21/2017 11:50 AM

## 2017-05-21 NOTE — Progress Notes (Signed)
Progress Note  Patient Name: Noah Walker Date of Encounter: 05/21/2017  Primary Cardiologist: Dr. Karin GoldenZan  Subjective   Feeling well. No chest pain.   Inpatient Medications    Scheduled Meds: . aspirin EC  325 mg Oral Daily  . chlorhexidine  60 mL Topical Once   And  . [START ON 05/22/2017] chlorhexidine  60 mL Topical Once  . [START ON 05/22/2017] chlorhexidine  15 mL Mouth/Throat Once  . [START ON 05/22/2017] diazepam  5 mg Oral Once  . folic acid  1 mg Oral Daily  . [START ON 05/22/2017] heparin-papaverine-plasmalyte irrigation   Irrigation To OR  . LORazepam  0-4 mg Intravenous Q12H  . [START ON 05/22/2017] magnesium sulfate  40 mEq Other To OR  . [START ON 05/22/2017] metoprolol tartrate  12.5 mg Oral Once  . mometasone-formoterol  2 puff Inhalation BID  . multivitamin with minerals  1 tablet Oral Daily  . nicotine  21 mg Transdermal Daily  . nitroGLYCERIN  1 inch Topical Once  . [START ON 05/22/2017] potassium chloride  80 mEq Other To OR  . rosuvastatin  40 mg Oral q1800  . sodium chloride flush  3 mL Intravenous Q12H  . thiamine  100 mg Oral Daily  . [START ON 05/22/2017] tranexamic acid  15 mg/kg Intravenous To OR  . [START ON 05/22/2017] tranexamic acid  2 mg/kg Intracatheter To OR   Continuous Infusions: . sodium chloride    . [START ON 05/22/2017] cefUROXime (ZINACEF)  IV    . [START ON 05/22/2017] cefUROXime (ZINACEF)  IV    . [START ON 05/22/2017] dexmedetomidine    . [START ON 05/22/2017] DOPamine    . [START ON 05/22/2017] epinephrine    . [START ON 05/22/2017] heparin 30,000 units/NS 1000 mL solution for CELLSAVER    . heparin 1,600 Units/hr (05/20/17 1732)  . [START ON 05/22/2017] insulin (NOVOLIN-R) infusion    . [START ON 05/22/2017] nitroGLYCERIN    . [START ON 05/22/2017] phenylephrine 20mg /23050mL NS (0.08mg /ml) infusion    . [START ON 05/22/2017] tranexamic acid (CYKLOKAPRON) infusion ([facility])    . [START ON 05/22/2017] vancomycin     PRN  Meds: sodium chloride, acetaminophen, ALPRAZolam, nitroGLYCERIN, ondansetron (ZOFRAN) IV, sodium chloride flush, temazepam   Vital Signs    Vitals:   05/20/17 2024 05/21/17 0643 05/21/17 1136 05/21/17 1153  BP:  131/62  (!) 142/73  Pulse:  (!) 55 62 60  Resp:  18 18 20   Temp:  98 F (36.7 C)  97.6 F (36.4 C)  TempSrc:  Oral  Oral  SpO2: 94% 96% 98% 100%  Weight:  210 lb 11.2 oz (95.6 kg)    Height:        Intake/Output Summary (Last 24 hours) at 05/21/17 1404 Last data filed at 05/21/17 1335  Gross per 24 hour  Intake          1044.73 ml  Output              950 ml  Net            94.73 ml   Filed Weights   05/19/17 0500 05/20/17 0530 05/21/17 0643  Weight: 211 lb 4.8 oz (95.8 kg) 210 lb 3.2 oz (95.3 kg) 210 lb 11.2 oz (95.6 kg)    Telemetry    SB - Personally Reviewed  Physical Exam   General: Well developed, well nourished, male appearing in no acute distress. Head: Normocephalic, atraumatic.  Neck: Supple without bruits, JVD. Lungs:  Resp regular and unlabored, CTA. Heart: RRR, S1, S2, no S3, S4, or murmur; no rub. Abdomen: Soft, non-tender, non-distended with normoactive bowel sounds. No hepatomegaly. No rebound/guarding. No obvious abdominal masses. Extremities: No clubbing, cyanosis, edema. Distal pedal pulses are 2+ bilaterally. Neuro: Alert and oriented X 3. Moves all extremities spontaneously. Psych: Normal affect.  Labs    Chemistry Recent Labs Lab 05/17/17 1922 05/19/17 0539 05/20/17 0509  NA 134* 136 138  K 4.0 4.1 3.6  CL 107 105 105  CO2 22 24 25   GLUCOSE 98 99 97  BUN 12 6 7   CREATININE 0.97 0.89 0.95  CALCIUM 8.3* 8.7* 8.9  PROT  --  6.2* 5.9*  ALBUMIN  --  3.7 3.6  AST  --  18 17  ALT  --  18 20  ALKPHOS  --  58 59  BILITOT  --  0.6 0.8  GFRNONAA >60 >60 >60  GFRAA >60 >60 >60  ANIONGAP 5 7 8      Hematology Recent Labs Lab 05/19/17 0539 05/20/17 0509 05/21/17 0252  WBC 6.4 6.3 5.6  RBC 4.42 4.31 4.28  HGB 15.0 14.7  14.6  HCT 43.6 42.5 42.3  MCV 98.6 98.6 98.8  MCH 33.9 34.1* 34.1*  MCHC 34.4 34.6 34.5  RDW 12.7 12.5 12.5  PLT 142* 132* 140*    Cardiac Enzymes Recent Labs Lab 05/18/17 0011 05/18/17 0528 05/18/17 1112  TROPONINI 0.06* 0.03* <0.03    Recent Labs Lab 05/17/17 1944  TROPIPOC 0.01     BNPNo results for input(s): BNP, PROBNP in the last 168 hours.   DDimer No results for input(s): DDIMER in the last 168 hours.    Radiology    No results found.  Cardiac Studies   Cath: 05/18/17  LEFT HEART CATH AND CORONARY ANGIOGRAPHY - 05/18/2017    Prox RCA to Mid RCA stent, 0 %stenosed.  Mid RCA lesion, 20 %stenosed.  Dist LAD lesion, 90 %stenosed.  Prox RCA lesion, 70 %stenosed.  Prox LAD to Mid LAD lesion, 95 %stenosed.  Prox Cx to Mid Cx lesion, 30 %stenosed.  Findings: 1. Significant 2v CAD with ulcerated 95% ostial LAD lesion and hazy 60-70% mid RCA lesion prior to stent 2. EF 45% with apical and inferoapical HK     Patient Profile     66 y.o. male with PMH CAD s/p prior PCI who presented on October 21 with chest pain. He was initially seen by Dr. Cristal Deer in consult at the request of Dr. Adela Lank for assistance with management.  He reported that he was in his usual state of health until evening of 10/21. He had been at home most of the day, but when he went to get into his truck he felt a rapid onset heavy/dull discomfort across his chest that radiated to his left shoulder. This was similar to how he felt during his previous MI. He also felt short of breath and diaphoretic at the time. His symptoms were resolved with NG, and EMS brought him to the ER for further evaluation.   Symptoms were concerning for unstable angina he was therefore referred for cardiac catheterization which revealed severe LAD and RCA disease. CT surgical consult was recommended.  Assessment & Plan    1. Unstable Angina: underwent cath on 10/22 showing severe LAD disease, along  with with lesion in the RCA prox to previously placed stent. TCTS consulted, seen by Dr. Donata Clay with plans for CABG 10/26. Remains on IV heparin. No chest pain.  --  asa, statin, nitropaste. No BB 2/2 bradycardia  2. HL: on statin  3. Tobacco use: using nicotine patch  4. ETOH use: on CIWA protocol   Signed, Laverda Page, NP  05/21/2017, 2:04 PM  Pager # [phone]  . I saw evaluated the patient today along with Laverda Page,, NP-C. I agree with her findings, examination and recommendations noted above. Stable. Remains on stable regimen. Ready for CABG tomorrow. No signs of EtOH withdrawal.   Bryan Lemma, M.D., M.S. Interventional Cardiologist   Pager # [phone] Phone # [phone] [address] [place], [place] [address]    For questions or updates, please contact [facility] Please consult [email or web address] for contact info under Cardiology/STEMI. Daytime calls, contact the Day Call APP (6a-8a) or assigned team (Teams A-D) provider (7:30a - 5p). All other daytime calls (7:30-5p), contact the Card Master @ [phone].   Nighttime calls, contact the assigned APP (5p-8p) or MD (6:30p-8p). Overnight calls (8p-6a), contact the on call Fellow @ [phone].

## 2017-05-21 NOTE — Progress Notes (Signed)
VASCULAR LAB PRELIMINARY  PRELIMINARY  PRELIMINARY  PRELIMINARY  Pre CABG Dopplers completed.    Find results under CV PROC  Manus Weedman, RVT 05/21/2017, 10:11 AM

## 2017-05-22 ENCOUNTER — Inpatient Hospital Stay (HOSPITAL_COMMUNITY)
Admission: EM | Disposition: A | Discharge status: Home / Self Care | Payer: Self-pay | Source: Home / Self Care | Attending: Cardiothoracic Surgery

## 2017-05-22 ENCOUNTER — Inpatient Hospital Stay (HOSPITAL_COMMUNITY): Payer: Medicare Other

## 2017-05-22 ENCOUNTER — Inpatient Hospital Stay (HOSPITAL_COMMUNITY): Payer: Medicare Other | Admitting: Anesthesiology

## 2017-05-22 ENCOUNTER — Encounter (HOSPITAL_COMMUNITY): Payer: Self-pay | Admitting: Certified Registered"

## 2017-05-22 DIAGNOSIS — Z951 Presence of aortocoronary bypass graft: Secondary | ICD-10-CM

## 2017-05-22 DIAGNOSIS — I25110 Atherosclerotic heart disease of native coronary artery with unstable angina pectoris: Secondary | ICD-10-CM

## 2017-05-22 DIAGNOSIS — I214 Non-ST elevation (NSTEMI) myocardial infarction: Secondary | ICD-10-CM

## 2017-05-22 HISTORY — PX: TEE WITHOUT CARDIOVERSION: SHX5443

## 2017-05-22 HISTORY — PX: CORONARY ARTERY BYPASS GRAFT: SHX141

## 2017-05-22 HISTORY — DX: Presence of aortocoronary bypass graft: Z95.1

## 2017-05-22 LAB — POCT I-STAT, CHEM 8
BUN: 10 mg/dL (ref 6–20)
BUN: 12 mg/dL (ref 6–20)
BUN: 8 mg/dL (ref 6–20)
BUN: 9 mg/dL (ref 6–20)
BUN: 10 mg/dL (ref 6–20)
CALCIUM ION: 1.12 mmol/L — AB (ref 1.15–1.40)
CHLORIDE: 103 mmol/L (ref 101–111)
CHLORIDE: 102 mmol/L (ref 101–111)
CHLORIDE: 98 mmol/L — AB (ref 101–111)
CREATININE: 0.6 mg/dL — AB (ref 0.61–1.24)
CREATININE: 0.8 mg/dL (ref 0.61–1.24)
CREATININE: 0.7 mg/dL (ref 0.61–1.24)
CREATININE: 0.7 mg/dL (ref 0.61–1.24)
Calcium, Ion: 1.21 mmol/L (ref 1.15–1.40)
Calcium, Ion: 1.04 mmol/L — ABNORMAL LOW (ref 1.15–1.40)
Calcium, Ion: 1.19 mmol/L (ref 1.15–1.40)
Calcium, Ion: 1.08 mmol/L — ABNORMAL LOW (ref 1.15–1.40)
Chloride: 100 mmol/L — ABNORMAL LOW (ref 101–111)
Chloride: 99 mmol/L — ABNORMAL LOW (ref 101–111)
Creatinine, Ser: 0.7 mg/dL (ref 0.61–1.24)
GLUCOSE: 143 mg/dL — AB (ref 65–99)
GLUCOSE: 149 mg/dL — AB (ref 65–99)
Glucose, Bld: 96 mg/dL (ref 65–99)
Glucose, Bld: 103 mg/dL — ABNORMAL HIGH (ref 65–99)
Glucose, Bld: 99 mg/dL (ref 65–99)
HCT: 30 % — ABNORMAL LOW (ref 39.0–52.0)
HCT: 35 % — ABNORMAL LOW (ref 39.0–52.0)
HEMATOCRIT: 39 % (ref 39.0–52.0)
HEMATOCRIT: 34 % — AB (ref 39.0–52.0)
HEMATOCRIT: 38 % — AB (ref 39.0–52.0)
HEMOGLOBIN: 12.9 g/dL — AB (ref 13.0–17.0)
HEMOGLOBIN: 11.9 g/dL — AB (ref 13.0–17.0)
Hemoglobin: 13.3 g/dL (ref 13.0–17.0)
Hemoglobin: 11.6 g/dL — ABNORMAL LOW (ref 13.0–17.0)
Hemoglobin: 10.2 g/dL — ABNORMAL LOW (ref 13.0–17.0)
POTASSIUM: 3.9 mmol/L (ref 3.5–5.1)
POTASSIUM: 4.1 mmol/L (ref 3.5–5.1)
POTASSIUM: 4.1 mmol/L (ref 3.5–5.1)
POTASSIUM: 5.1 mmol/L (ref 3.5–5.1)
Potassium: 4.5 mmol/L (ref 3.5–5.1)
SODIUM: 139 mmol/L (ref 135–145)
Sodium: 139 mmol/L (ref 135–145)
Sodium: 139 mmol/L (ref 135–145)
Sodium: 133 mmol/L — ABNORMAL LOW (ref 135–145)
Sodium: 135 mmol/L (ref 135–145)
TCO2: 27 mmol/L (ref 22–32)
TCO2: 25 mmol/L (ref 22–32)
TCO2: 28 mmol/L (ref 22–32)
TCO2: 25 mmol/L (ref 22–32)
TCO2: 25 mmol/L (ref 22–32)

## 2017-05-22 LAB — CBC
HEMATOCRIT: 38 % — AB (ref 39.0–52.0)
Hemoglobin: 12.8 g/dL — ABNORMAL LOW (ref 13.0–17.0)
MCH: 33.7 pg (ref 26.0–34.0)
MCHC: 33.7 g/dL (ref 30.0–36.0)
MCV: 100 fL (ref 78.0–100.0)
Platelets: 89 10*3/uL — ABNORMAL LOW (ref 150–400)
RBC: 3.8 MIL/uL — ABNORMAL LOW (ref 4.22–5.81)
RDW: 12.6 % (ref 11.5–15.5)
WBC: 11.2 10*3/uL — ABNORMAL HIGH (ref 4.0–10.5)

## 2017-05-22 LAB — POCT I-STAT 3, ART BLOOD GAS (G3+)
ACID-BASE DEFICIT: 1 mmol/L (ref 0.0–2.0)
ACID-BASE EXCESS: 2 mmol/L (ref 0.0–2.0)
Acid-Base Excess: 1 mmol/L (ref 0.0–2.0)
BICARBONATE: 27.4 mmol/L (ref 20.0–28.0)
BICARBONATE: 25.6 mmol/L (ref 20.0–28.0)
BICARBONATE: 26.8 mmol/L (ref 20.0–28.0)
O2 SAT: 99 %
O2 Saturation: 100 %
O2 Saturation: 97 %
PH ART: 7.36 (ref 7.350–7.450)
PO2 ART: 337 mmHg — AB (ref 83.0–108.0)
PO2 ART: 89 mmHg (ref 83.0–108.0)
TCO2: 29 mmol/L (ref 22–32)
TCO2: 27 mmol/L (ref 22–32)
TCO2: 28 mmol/L (ref 22–32)
pCO2 arterial: 44.1 mmHg (ref 32.0–48.0)
pCO2 arterial: 49.6 mmHg — ABNORMAL HIGH (ref 32.0–48.0)
pCO2 arterial: 47 mmHg (ref 32.0–48.0)
pH, Arterial: 7.401 (ref 7.350–7.450)
pH, Arterial: 7.32 — ABNORMAL LOW (ref 7.350–7.450)
pO2, Arterial: 153 mmHg — ABNORMAL HIGH (ref 83.0–108.0)

## 2017-05-22 LAB — GLUCOSE, CAPILLARY
GLUCOSE-CAPILLARY: 135 mg/dL — AB (ref 65–99)
Glucose-Capillary: 122 mg/dL — ABNORMAL HIGH (ref 65–99)

## 2017-05-22 LAB — HEPARIN LEVEL (UNFRACTIONATED): HEPARIN UNFRACTIONATED: 0.46 [IU]/mL (ref 0.30–0.70)

## 2017-05-22 LAB — HEMOGLOBIN AND HEMATOCRIT, BLOOD
HCT: 32.7 % — ABNORMAL LOW (ref 39.0–52.0)
Hemoglobin: 11.5 g/dL — ABNORMAL LOW (ref 13.0–17.0)

## 2017-05-22 LAB — PROTIME-INR
INR: 1.21
Prothrombin Time: 15.2 seconds (ref 11.4–15.2)

## 2017-05-22 LAB — APTT: aPTT: 26 seconds (ref 24–36)

## 2017-05-22 LAB — SURGICAL PCR SCREEN
MRSA, PCR: NEGATIVE
Staphylococcus aureus: NEGATIVE

## 2017-05-22 LAB — PLATELET COUNT: Platelets: 114 10*3/uL — ABNORMAL LOW (ref 150–400)

## 2017-05-22 SURGERY — CORONARY ARTERY BYPASS GRAFTING (CABG)
Anesthesia: General | Site: Chest

## 2017-05-22 MED ORDER — 0.9 % SODIUM CHLORIDE (POUR BTL) OPTIME
TOPICAL | Status: DC | PRN
  Administered 2017-05-22: 1000 mL

## 2017-05-22 MED ORDER — SODIUM CHLORIDE 0.9 % IV SOLN
0.0000 ug/kg/h | INTRAVENOUS | Status: DC
  Filled 2017-05-22: qty 2

## 2017-05-22 MED ORDER — BISACODYL 5 MG PO TBEC
10.0000 mg | DELAYED_RELEASE_TABLET | Freq: Every day | ORAL | Status: DC
  Administered 2017-05-23 – 2017-05-24 (×2): 10 mg via ORAL
  Filled 2017-05-22 (×2): qty 2

## 2017-05-22 MED ORDER — SODIUM CHLORIDE 0.9 % IV SOLN: INTRAVENOUS | Status: DC

## 2017-05-22 MED ORDER — ASPIRIN EC 325 MG PO TBEC
325.0000 mg | DELAYED_RELEASE_TABLET | Freq: Every day | ORAL | Status: DC
  Administered 2017-05-23 – 2017-05-24 (×2): 325 mg via ORAL
  Filled 2017-05-22 (×2): qty 1

## 2017-05-22 MED ORDER — INSULIN REGULAR BOLUS VIA INFUSION
0.0000 [IU] | Freq: Three times a day (TID) | INTRAVENOUS | Status: DC
  Filled 2017-05-22: qty 10

## 2017-05-22 MED ORDER — LACTATED RINGERS IV SOLN
INTRAVENOUS | Status: DC
  Administered 2017-05-23: 02:00:00 via INTRAVENOUS

## 2017-05-22 MED ORDER — CHLORHEXIDINE GLUCONATE 0.12 % MT SOLN
15.0000 mL | OROMUCOSAL | Status: AC
  Administered 2017-05-22: 15 mL via OROMUCOSAL

## 2017-05-22 MED ORDER — LACTATED RINGERS IV SOLN
INTRAVENOUS | Status: DC
  Administered 2017-05-22 (×2): via INTRAVENOUS

## 2017-05-22 MED ORDER — ACETAMINOPHEN 160 MG/5ML PO SOLN: 1000.0000 mg | Freq: Four times a day (QID) | ORAL | Status: DC

## 2017-05-22 MED ORDER — LACTATED RINGERS IV SOLN
INTRAVENOUS | Status: DC | PRN
  Administered 2017-05-22: 16:00:00 via INTRAVENOUS

## 2017-05-22 MED ORDER — ORAL CARE MOUTH RINSE
15.0000 mL | OROMUCOSAL | Status: DC
  Administered 2017-05-23: 15 mL via OROMUCOSAL

## 2017-05-22 MED ORDER — POTASSIUM CHLORIDE 10 MEQ/50ML IV SOLN: 10.0000 meq | INTRAVENOUS | Status: AC

## 2017-05-22 MED ORDER — ACETAMINOPHEN 500 MG PO TABS
1000.0000 mg | ORAL_TABLET | Freq: Four times a day (QID) | ORAL | Status: DC
  Administered 2017-05-23 – 2017-05-24 (×5): 1000 mg via ORAL
  Filled 2017-05-22 (×5): qty 2

## 2017-05-22 MED ORDER — CHLORHEXIDINE GLUCONATE CLOTH 2 % EX PADS
6.0000 | MEDICATED_PAD | Freq: Every day | CUTANEOUS | Status: DC
  Administered 2017-05-22: 6 via TOPICAL

## 2017-05-22 MED ORDER — ACETAMINOPHEN 160 MG/5ML PO SOLN: 650.0000 mg | Freq: Once | ORAL | Status: AC

## 2017-05-22 MED ORDER — ALBUMIN HUMAN 5 % IV SOLN
INTRAVENOUS | Status: DC | PRN
  Administered 2017-05-22: 19:00:00 via INTRAVENOUS

## 2017-05-22 MED ORDER — METOPROLOL TARTRATE 5 MG/5ML IV SOLN: 2.5000 mg | INTRAVENOUS | Status: DC | PRN

## 2017-05-22 MED ORDER — VECURONIUM BROMIDE 10 MG IV SOLR
INTRAVENOUS | Status: DC | PRN
  Administered 2017-05-22: 3 mg via INTRAVENOUS

## 2017-05-22 MED ORDER — MIDAZOLAM HCL 2 MG/2ML IJ SOLN
INTRAMUSCULAR | Status: AC
  Administered 2017-05-22: 2 mg
  Filled 2017-05-22: qty 2

## 2017-05-22 MED ORDER — DEXTROSE 5 % IV SOLN
1.5000 g | Freq: Two times a day (BID) | INTRAVENOUS | Status: DC
  Administered 2017-05-23 – 2017-05-24 (×3): 1.5 g via INTRAVENOUS
  Filled 2017-05-22 (×4): qty 1.5

## 2017-05-22 MED ORDER — LACTATED RINGERS IV SOLN
500.0000 mL | Freq: Once | INTRAVENOUS | Status: AC | PRN
  Administered 2017-05-23: 500 mL via INTRAVENOUS

## 2017-05-22 MED ORDER — TRAMADOL HCL 50 MG PO TABS
50.0000 mg | ORAL_TABLET | ORAL | Status: DC | PRN
  Administered 2017-05-23: 100 mg via ORAL
  Filled 2017-05-22: qty 2

## 2017-05-22 MED ORDER — FENTANYL CITRATE (PF) 100 MCG/2ML IJ SOLN
INTRAMUSCULAR | Status: AC
  Administered 2017-05-22: 50 ug
  Filled 2017-05-22: qty 2

## 2017-05-22 MED ORDER — LIDOCAINE 2% (20 MG/ML) 5 ML SYRINGE
INTRAMUSCULAR | Status: DC | PRN
  Administered 2017-05-22: 40 mg via INTRAVENOUS

## 2017-05-22 MED ORDER — MIDAZOLAM HCL 5 MG/5ML IJ SOLN
INTRAMUSCULAR | Status: DC | PRN
  Administered 2017-05-22: 4 mg via INTRAVENOUS
  Administered 2017-05-22: 2 mg via INTRAVENOUS
  Administered 2017-05-22: 4 mg via INTRAVENOUS

## 2017-05-22 MED ORDER — MORPHINE SULFATE (PF) 4 MG/ML IV SOLN
2.0000 mg | INTRAVENOUS | Status: DC | PRN
  Administered 2017-05-23: 2 mg via INTRAVENOUS
  Administered 2017-05-23 – 2017-05-24 (×7): 4 mg via INTRAVENOUS
  Filled 2017-05-22 (×8): qty 1

## 2017-05-22 MED ORDER — MIDAZOLAM HCL 10 MG/2ML IJ SOLN
INTRAMUSCULAR | Status: AC
  Filled 2017-05-22: qty 2

## 2017-05-22 MED ORDER — FAMOTIDINE IN NACL 20-0.9 MG/50ML-% IV SOLN
20.0000 mg | Freq: Two times a day (BID) | INTRAVENOUS | Status: AC
  Administered 2017-05-22: 20 mg via INTRAVENOUS
  Filled 2017-05-22: qty 50

## 2017-05-22 MED ORDER — PANTOPRAZOLE SODIUM 40 MG PO TBEC
40.0000 mg | DELAYED_RELEASE_TABLET | Freq: Every day | ORAL | Status: DC
  Administered 2017-05-24: 40 mg via ORAL
  Filled 2017-05-22: qty 1

## 2017-05-22 MED ORDER — THROMBIN (RECOMBINANT) 5000 UNITS EX SOLR
CUTANEOUS | Status: AC
  Filled 2017-05-22: qty 5000

## 2017-05-22 MED ORDER — TRANEXAMIC ACID 1000 MG/10ML IV SOLN
1.5000 mg/kg/h | INTRAVENOUS | Status: DC
  Filled 2017-05-22: qty 25

## 2017-05-22 MED ORDER — FENTANYL CITRATE (PF) 250 MCG/5ML IJ SOLN
INTRAMUSCULAR | Status: AC
  Filled 2017-05-22: qty 5

## 2017-05-22 MED ORDER — CHLORHEXIDINE GLUCONATE 0.12% ORAL RINSE (MEDLINE KIT): 15.0000 mL | Freq: Two times a day (BID) | OROMUCOSAL | Status: DC

## 2017-05-22 MED ORDER — SODIUM CHLORIDE 0.9% FLUSH
3.0000 mL | Freq: Two times a day (BID) | INTRAVENOUS | Status: DC
  Administered 2017-05-23 – 2017-05-24 (×3): 3 mL via INTRAVENOUS

## 2017-05-22 MED ORDER — FENTANYL CITRATE (PF) 250 MCG/5ML IJ SOLN
INTRAMUSCULAR | Status: DC | PRN
  Administered 2017-05-22: 150 ug via INTRAVENOUS
  Administered 2017-05-22: 100 ug via INTRAVENOUS
  Administered 2017-05-22 (×2): 250 ug via INTRAVENOUS
  Administered 2017-05-22: 100 ug via INTRAVENOUS
  Administered 2017-05-22 (×2): 250 ug via INTRAVENOUS
  Administered 2017-05-22: 150 ug via INTRAVENOUS
  Administered 2017-05-22: 250 ug via INTRAVENOUS

## 2017-05-22 MED ORDER — HEMOSTATIC AGENTS (NO CHARGE) OPTIME
TOPICAL | Status: DC | PRN
  Administered 2017-05-22: 1 via TOPICAL

## 2017-05-22 MED ORDER — ARTIFICIAL TEARS OPHTHALMIC OINT
TOPICAL_OINTMENT | OPHTHALMIC | Status: DC | PRN
  Administered 2017-05-22: 1 via OPHTHALMIC

## 2017-05-22 MED ORDER — HEMOSTATIC AGENTS (NO CHARGE) OPTIME
TOPICAL | Status: DC | PRN
  Administered 2017-05-22 (×2): 1 via TOPICAL

## 2017-05-22 MED ORDER — SODIUM CHLORIDE 0.9% FLUSH: 10.0000 mL | INTRAVENOUS | Status: DC | PRN

## 2017-05-22 MED ORDER — METOPROLOL TARTRATE 12.5 MG HALF TABLET
12.5000 mg | ORAL_TABLET | Freq: Two times a day (BID) | ORAL | Status: DC
  Administered 2017-05-23 – 2017-05-24 (×3): 12.5 mg via ORAL
  Filled 2017-05-22 (×3): qty 1

## 2017-05-22 MED ORDER — FENTANYL CITRATE (PF) 250 MCG/5ML IJ SOLN
INTRAMUSCULAR | Status: AC
  Filled 2017-05-22: qty 25

## 2017-05-22 MED ORDER — ROCURONIUM BROMIDE 10 MG/ML (PF) SYRINGE
PREFILLED_SYRINGE | INTRAVENOUS | Status: DC | PRN
  Administered 2017-05-22 (×4): 50 mg via INTRAVENOUS

## 2017-05-22 MED ORDER — ACETAMINOPHEN 650 MG RE SUPP
650.0000 mg | Freq: Once | RECTAL | Status: AC
  Administered 2017-05-22: 650 mg via RECTAL

## 2017-05-22 MED ORDER — ALBUMIN HUMAN 5 % IV SOLN
250.0000 mL | INTRAVENOUS | Status: AC | PRN
  Administered 2017-05-22: 250 mL via INTRAVENOUS

## 2017-05-22 MED ORDER — SODIUM CHLORIDE 0.45 % IV SOLN
INTRAVENOUS | Status: DC | PRN
  Administered 2017-05-22: 21:00:00 via INTRAVENOUS

## 2017-05-22 MED ORDER — MAGNESIUM SULFATE 4 GM/100ML IV SOLN
4.0000 g | Freq: Once | INTRAVENOUS | Status: AC
  Administered 2017-05-22: 4 g via INTRAVENOUS
  Filled 2017-05-22: qty 100

## 2017-05-22 MED ORDER — NITROGLYCERIN IN D5W 200-5 MCG/ML-% IV SOLN: 0.0000 ug/min | INTRAVENOUS | Status: DC

## 2017-05-22 MED ORDER — MORPHINE SULFATE (PF) 4 MG/ML IV SOLN
1.0000 mg | INTRAVENOUS | Status: AC | PRN
  Administered 2017-05-22: 2 mg via INTRAVENOUS
  Administered 2017-05-23: 1 mg via INTRAVENOUS
  Filled 2017-05-22: qty 1

## 2017-05-22 MED ORDER — VANCOMYCIN HCL IN DEXTROSE 1-5 GM/200ML-% IV SOLN
1000.0000 mg | Freq: Once | INTRAVENOUS | Status: AC
  Administered 2017-05-23: 1000 mg via INTRAVENOUS
  Filled 2017-05-22: qty 200

## 2017-05-22 MED ORDER — OXYCODONE HCL 5 MG PO TABS
5.0000 mg | ORAL_TABLET | ORAL | Status: DC | PRN
  Administered 2017-05-23 (×3): 10 mg via ORAL
  Administered 2017-05-23 (×2): 5 mg via ORAL
  Administered 2017-05-23 – 2017-05-24 (×4): 10 mg via ORAL
  Filled 2017-05-22 (×6): qty 2
  Filled 2017-05-22: qty 1
  Filled 2017-05-22 (×2): qty 2

## 2017-05-22 MED ORDER — SODIUM CHLORIDE 0.9 % IV SOLN: 250.0000 mL | INTRAVENOUS | Status: DC

## 2017-05-22 MED ORDER — ONDANSETRON HCL 4 MG/2ML IJ SOLN: 4.0000 mg | Freq: Four times a day (QID) | INTRAMUSCULAR | Status: DC | PRN

## 2017-05-22 MED ORDER — BISACODYL 10 MG RE SUPP: 10.0000 mg | Freq: Every day | RECTAL | Status: DC

## 2017-05-22 MED ORDER — SODIUM CHLORIDE 0.9 % IV SOLN
0.0000 ug/min | INTRAVENOUS | Status: DC
  Filled 2017-05-22: qty 2

## 2017-05-22 MED ORDER — LACTATED RINGERS IV SOLN: INTRAVENOUS | Status: DC

## 2017-05-22 MED ORDER — EPHEDRINE 5 MG/ML INJ
INTRAVENOUS | Status: AC
  Filled 2017-05-22: qty 10

## 2017-05-22 MED ORDER — ASPIRIN 81 MG PO CHEW
324.0000 mg | CHEWABLE_TABLET | Freq: Every day | ORAL | Status: DC
  Filled 2017-05-22: qty 4

## 2017-05-22 MED ORDER — SODIUM CHLORIDE 0.9% FLUSH
10.0000 mL | Freq: Two times a day (BID) | INTRAVENOUS | Status: DC
  Administered 2017-05-22 – 2017-05-24 (×5): 10 mL

## 2017-05-22 MED ORDER — HEPARIN SODIUM (PORCINE) 1000 UNIT/ML IJ SOLN
INTRAMUSCULAR | Status: DC | PRN
  Administered 2017-05-22: 32000 [IU] via INTRAVENOUS
  Administered 2017-05-22: 2000 [IU] via INTRAVENOUS

## 2017-05-22 MED ORDER — PROPOFOL 10 MG/ML IV BOLUS
INTRAVENOUS | Status: DC | PRN
  Administered 2017-05-22: 60 mg via INTRAVENOUS

## 2017-05-22 MED ORDER — PROTAMINE SULFATE 10 MG/ML IV SOLN
INTRAVENOUS | Status: DC | PRN
  Administered 2017-05-22: 10 mg via INTRAVENOUS
  Administered 2017-05-22: 330 mg via INTRAVENOUS

## 2017-05-22 MED ORDER — SODIUM CHLORIDE 0.9% FLUSH: 3.0000 mL | INTRAVENOUS | Status: DC | PRN

## 2017-05-22 MED ORDER — MIDAZOLAM HCL 2 MG/2ML IJ SOLN: 2.0000 mg | INTRAMUSCULAR | Status: DC | PRN

## 2017-05-22 MED ORDER — LIDOCAINE 2% (20 MG/ML) 5 ML SYRINGE
INTRAMUSCULAR | Status: AC
  Filled 2017-05-22: qty 10

## 2017-05-22 MED ORDER — DOCUSATE SODIUM 100 MG PO CAPS
200.0000 mg | ORAL_CAPSULE | Freq: Every day | ORAL | Status: DC
  Administered 2017-05-23 – 2017-05-24 (×2): 200 mg via ORAL
  Filled 2017-05-22 (×2): qty 2

## 2017-05-22 MED ORDER — METOPROLOL TARTRATE 25 MG/10 ML ORAL SUSPENSION: 12.5000 mg | Freq: Two times a day (BID) | ORAL | Status: DC

## 2017-05-22 SURGICAL SUPPLY — 103 items
ADAPTER CARDIO PERF ANTE/RETRO (ADAPTER) ×4 IMPLANT
BAG DECANTER FOR FLEXI CONT (MISCELLANEOUS) ×4 IMPLANT
BANDAGE ACE 4X5 VEL STRL LF (GAUZE/BANDAGES/DRESSINGS) ×4 IMPLANT
BANDAGE ACE 6X5 VEL STRL LF (GAUZE/BANDAGES/DRESSINGS) ×4 IMPLANT
BASKET HEART  (ORDER IN 25'S) (MISCELLANEOUS) ×1
BASKET HEART (ORDER IN 25'S) (MISCELLANEOUS) ×1
BASKET HEART (ORDER IN 25S) (MISCELLANEOUS) ×2 IMPLANT
BLADE CLIPPER SURG (BLADE) IMPLANT
BLADE STERNUM SYSTEM 6 (BLADE) ×4 IMPLANT
BLADE SURG 12 STRL SS (BLADE) ×4 IMPLANT
BNDG GAUZE ELAST 4 BULKY (GAUZE/BANDAGES/DRESSINGS) ×4 IMPLANT
CANISTER SUCT 3000ML PPV (MISCELLANEOUS) ×4 IMPLANT
CANNULA GUNDRY RCSP 15FR (MISCELLANEOUS) ×4 IMPLANT
CATH CPB KIT VANTRIGT (MISCELLANEOUS) ×4 IMPLANT
CATH ROBINSON RED A/P 18FR (CATHETERS) ×12 IMPLANT
CATH THORACIC 36FR RT ANG (CATHETERS) ×4 IMPLANT
CLIP VESOCCLUDE SM WIDE 24/CT (CLIP) ×4 IMPLANT
CONN ST 1/4X3/8  BEN (MISCELLANEOUS) ×2
CONN ST 1/4X3/8 BEN (MISCELLANEOUS) ×2 IMPLANT
CRADLE DONUT ADULT HEAD (MISCELLANEOUS) ×4 IMPLANT
DRAIN CHANNEL 32F RND 10.7 FF (WOUND CARE) ×4 IMPLANT
DRAPE CARDIOVASCULAR INCISE (DRAPES) ×2
DRAPE SLUSH/WARMER DISC (DRAPES) ×4 IMPLANT
DRAPE SRG 135X102X78XABS (DRAPES) ×2 IMPLANT
DRSG AQUACEL AG ADV 3.5X14 (GAUZE/BANDAGES/DRESSINGS) ×4 IMPLANT
ELECT BLADE 4.0 EZ CLEAN MEGAD (MISCELLANEOUS) ×4
ELECT BLADE 6.5 EXT (BLADE) ×4 IMPLANT
ELECT CAUTERY BLADE 6.4 (BLADE) ×4 IMPLANT
ELECT REM PT RETURN 9FT ADLT (ELECTROSURGICAL) ×8
ELECTRODE BLDE 4.0 EZ CLN MEGD (MISCELLANEOUS) ×2 IMPLANT
ELECTRODE REM PT RTRN 9FT ADLT (ELECTROSURGICAL) ×4 IMPLANT
FELT TEFLON 1X6 (MISCELLANEOUS) ×4 IMPLANT
GAUZE SPONGE 4X4 12PLY STRL (GAUZE/BANDAGES/DRESSINGS) ×8 IMPLANT
GAUZE SPONGE 4X4 12PLY STRL LF (GAUZE/BANDAGES/DRESSINGS) ×8 IMPLANT
GLOVE BIO SURGEON STRL SZ7.5 (GLOVE) ×12 IMPLANT
GOWN STRL REUS W/ TWL LRG LVL3 (GOWN DISPOSABLE) ×16 IMPLANT
GOWN STRL REUS W/TWL LRG LVL3 (GOWN DISPOSABLE) ×16
HEMOSTAT POWDER SURGIFOAM 1G (HEMOSTASIS) ×12 IMPLANT
HEMOSTAT SURGICEL 2X14 (HEMOSTASIS) ×4 IMPLANT
INSERT FOGARTY XLG (MISCELLANEOUS) IMPLANT
KIT BASIN OR (CUSTOM PROCEDURE TRAY) ×4 IMPLANT
KIT ROOM TURNOVER OR (KITS) ×4 IMPLANT
KIT SUCTION CATH 14FR (SUCTIONS) ×4 IMPLANT
KIT VASOVIEW HEMOPRO VH 3000 (KITS) ×4 IMPLANT
LEAD PACING MYOCARDI (MISCELLANEOUS) ×4 IMPLANT
MARKER GRAFT CORONARY BYPASS (MISCELLANEOUS) ×12 IMPLANT
NS IRRIG 1000ML POUR BTL (IV SOLUTION) ×24 IMPLANT
PACK OPEN HEART (CUSTOM PROCEDURE TRAY) ×4 IMPLANT
PAD ARMBOARD 7.5X6 YLW CONV (MISCELLANEOUS) ×8 IMPLANT
PAD ELECT DEFIB RADIOL ZOLL (MISCELLANEOUS) ×4 IMPLANT
PENCIL BUTTON HOLSTER BLD 10FT (ELECTRODE) ×4 IMPLANT
POWDER SURGICEL 3.0 GRAM (HEMOSTASIS) ×4 IMPLANT
PUNCH AORTIC ROTATE  4.5MM 8IN (MISCELLANEOUS) ×4 IMPLANT
PUNCH AORTIC ROTATE 4.0MM (MISCELLANEOUS) IMPLANT
PUNCH AORTIC ROTATE 4.5MM 8IN (MISCELLANEOUS) IMPLANT
PUNCH AORTIC ROTATE 5MM 8IN (MISCELLANEOUS) IMPLANT
SET CARDIOPLEGIA MPS 5001102 (MISCELLANEOUS) ×4 IMPLANT
SPONGE LAP 18X18 X RAY DECT (DISPOSABLE) ×8 IMPLANT
SURGIFLO W/THROMBIN 8M KIT (HEMOSTASIS) ×4 IMPLANT
SUT BONE WAX W31G (SUTURE) ×4 IMPLANT
SUT ETHIBOND 2 0 SH (SUTURE) ×8
SUT ETHIBOND 2 0 SH 36X2 (SUTURE) ×8 IMPLANT
SUT MNCRL AB 4-0 PS2 18 (SUTURE) IMPLANT
SUT PROLENE 3 0 SH DA (SUTURE) IMPLANT
SUT PROLENE 3 0 SH1 36 (SUTURE) IMPLANT
SUT PROLENE 4 0 RB 1 (SUTURE) ×6
SUT PROLENE 4 0 SH DA (SUTURE) ×8 IMPLANT
SUT PROLENE 4-0 RB1 .5 CRCL 36 (SUTURE) ×6 IMPLANT
SUT PROLENE 5 0 C 1 36 (SUTURE) ×8 IMPLANT
SUT PROLENE 6 0 C 1 30 (SUTURE) ×16 IMPLANT
SUT PROLENE 6 0 CC (SUTURE) ×12 IMPLANT
SUT PROLENE 8 0 BV175 6 (SUTURE) ×8 IMPLANT
SUT PROLENE BLUE 7 0 (SUTURE) ×4 IMPLANT
SUT SILK  1 MH (SUTURE) ×6
SUT SILK 1 MH (SUTURE) ×6 IMPLANT
SUT SILK 1 TIES 10X30 (SUTURE) ×4 IMPLANT
SUT SILK 2 0 SH CR/8 (SUTURE) ×12 IMPLANT
SUT SILK 2 0 TIES 10X30 (SUTURE) ×4 IMPLANT
SUT SILK 2 0 TIES 17X18 (SUTURE) ×2
SUT SILK 2-0 18XBRD TIE BLK (SUTURE) ×2 IMPLANT
SUT SILK 3 0 SH CR/8 (SUTURE) ×4 IMPLANT
SUT SILK 4 0 TIE 10X30 (SUTURE) ×8 IMPLANT
SUT STEEL 6MS V (SUTURE) ×4 IMPLANT
SUT STEEL SZ 6 DBL 3X14 BALL (SUTURE) ×4 IMPLANT
SUT TEM PAC WIRE 2 0 SH (SUTURE) ×8 IMPLANT
SUT VIC AB 1 CTX 36 (SUTURE) ×4
SUT VIC AB 1 CTX36XBRD ANBCTR (SUTURE) ×4 IMPLANT
SUT VIC AB 2-0 CT1 27 (SUTURE) ×2
SUT VIC AB 2-0 CT1 TAPERPNT 27 (SUTURE) ×2 IMPLANT
SUT VIC AB 2-0 CTX 27 (SUTURE) ×8 IMPLANT
SUT VIC AB 3-0 X1 27 (SUTURE) ×12 IMPLANT
SUTURE E-PAK OPEN HEART (SUTURE) IMPLANT
SYSTEM SAHARA CHEST DRAIN ATS (WOUND CARE) ×4 IMPLANT
TAPE CLOTH SURG 4X10 WHT LF (GAUZE/BANDAGES/DRESSINGS) ×8 IMPLANT
TAPE PAPER 2X10 WHT MICROPORE (GAUZE/BANDAGES/DRESSINGS) ×4 IMPLANT
TOWEL GREEN STERILE (TOWEL DISPOSABLE) ×8 IMPLANT
TOWEL GREEN STERILE FF (TOWEL DISPOSABLE) ×8 IMPLANT
TOWEL OR 17X24 6PK STRL BLUE (TOWEL DISPOSABLE) IMPLANT
TOWEL OR 17X26 10 PK STRL BLUE (TOWEL DISPOSABLE) IMPLANT
TRAY FOLEY SILVER 16FR TEMP (SET/KITS/TRAYS/PACK) ×4 IMPLANT
TUBING INSUFFLATION (TUBING) ×4 IMPLANT
UNDERPAD 30X30 (UNDERPADS AND DIAPERS) ×4 IMPLANT
WATER STERILE IRR 1000ML POUR (IV SOLUTION) ×8 IMPLANT

## 2017-05-22 NOTE — Progress Notes (Signed)
Pre Procedure note for inpatients:   METHOD SATER has been scheduled for Procedure(s): CORONARY ARTERY BYPASS GRAFTING (CABG) (N/A) TRANSESOPHAGEAL ECHOCARDIOGRAM (TEE) (N/A) today. The various methods of treatment have been discussed with the patient. After consideration of the risks, benefits and treatment options the patient has consented to the planned procedure.   The patient has been seen and labs reviewed. There are no changes in the patient's condition to prevent proceeding with the planned procedure today.  Recent labs:  Lab Results  Component Value Date   WBC 7.1 05/21/2017   HGB 14.9 05/21/2017   HCT 42.9 05/21/2017   PLT 136 (L) 05/21/2017   GLUCOSE 105 (H) 05/21/2017   CHOL 192 05/19/2017   TRIG 168 (H) 05/19/2017   HDL 41 05/19/2017   LDLCALC 117 (H) 05/19/2017   ALT 20 05/20/2017   AST 17 05/20/2017   NA 136 05/21/2017   K 3.9 05/21/2017   CL 104 05/21/2017   CREATININE 0.98 05/21/2017   BUN 9 05/21/2017   CO2 23 05/21/2017   TSH 3.664 05/20/2017   INR 0.98 05/19/2017   HGBA1C 4.9 05/19/2017    Mikey Bussing, MD 05/22/2017 3:12 PM

## 2017-05-22 NOTE — Transfer of Care (Signed)
Immediate Anesthesia Transfer of Care Note  Patient: Noah Walker  Procedure(s) Performed: CORONARY ARTERY BYPASS GRAFTING (CABG) X 2 WITH ENDOSCOPIC HARVESTING OF RIGHT SAPHENOUS VEIN.LIMA-LAD SVG-RCA (N/A Chest) TRANSESOPHAGEAL ECHOCARDIOGRAM (TEE) (N/A )  Patient Location: SICU  Anesthesia Type:General  Level of Consciousness: sedated and Patient remains intubated per anesthesia plan  Airway & Oxygen Therapy: Patient remains intubated per anesthesia plan and Patient placed on Ventilator (see vital sign flow sheet for setting)  Post-op Assessment: Report given to RN and Post -op Vital signs reviewed and stable  Post vital signs: Reviewed and stable  Last Vitals:  Vitals:   05/22/17 1137 05/22/17 2037  BP: 139/87   Pulse: 61   Resp:    Temp:    SpO2: 96% 96%    Last Pain:  Vitals:   05/22/17 1001  TempSrc:   PainSc: 0-No pain         Complications: No apparent anesthesia complications   PVT at bedside. VSS.

## 2017-05-22 NOTE — Anesthesia Procedure Notes (Signed)
Central Venous Catheter Insertion Performed by: Dorris Singh Start/End10/26/2018 2:05 PM, 05/22/2017 2:25 PM Patient location: Pre-op. Preanesthetic checklist: patient identified, IV checked, site marked, risks and benefits discussed, surgical consent, monitors and equipment checked, pre-op evaluation and timeout performed Position: Trendelenburg Lidocaine 1% used for infiltration and patient sedated Hand hygiene performed , maximum sterile barriers used  and Seldinger technique used PA cath was placed.Sheath introducer Swan type:thermodilution Procedure performed using ultrasound guided technique. Ultrasound Notes:anatomy identified Attempts: 1 Following insertion, line sutured, dressing applied and Biopatch. Post procedure assessment: blood return through all ports  Patient tolerated the procedure well with no immediate complications.

## 2017-05-22 NOTE — Anesthesia Preprocedure Evaluation (Signed)
Anesthesia Evaluation  Patient identified by MRN, date of birth, ID band Patient awake    Reviewed: Allergy & Precautions, NPO status , Patient's Chart, lab work & pertinent test results  Airway Mallampati: II  TM Distance: >3 FB     Dental   Pulmonary Current Smoker,    breath sounds clear to auscultation       Cardiovascular hypertension, + angina + CAD and + Past MI   Rhythm:Regular Rate:Normal     Neuro/Psych    GI/Hepatic negative GI ROS, Neg liver ROS,   Endo/Other  negative endocrine ROS  Renal/GU negative Renal ROS     Musculoskeletal   Abdominal   Peds  Hematology   Anesthesia Other Findings   Reproductive/Obstetrics                             Anesthesia Physical Anesthesia Plan  ASA: IV  Anesthesia Plan: General   Post-op Pain Management:    Induction: Intravenous  PONV Risk Score and Plan: 1 and Ondansetron, Dexamethasone, Treatment may vary due to age or medical condition and Midazolam  Airway Management Planned:   Additional Equipment: Arterial line, PA Cath, TEE and Ultrasound Guidance Line Placement  Intra-op Plan:   Post-operative Plan: Post-operative intubation/ventilation  Informed Consent: I have reviewed the patients History and Physical, chart, labs and discussed the procedure including the risks, benefits and alternatives for the proposed anesthesia with the patient or authorized representative who has indicated his/her understanding and acceptance.   Dental advisory given  Plan Discussed with: CRNA and Anesthesiologist  Anesthesia Plan Comments:         Anesthesia Quick Evaluation

## 2017-05-22 NOTE — Progress Notes (Signed)
ANTICOAGULATION CONSULT NOTE - Follow Up Consult  Pharmacy Consult for heparin Indication: 2V CAD   Patient Measurements: Height: 6' (182.9 cm) Weight: 209 lb 14.4 oz (95.2 kg) IBW/kg (Calculated) : 77.6 Heparin Dosing Weight: 95.2 kg  Labs:  Recent Labs  05/20/17 0509 05/21/17 0252 05/21/17 1755 05/22/17 0454  HGB 14.7 14.6 14.9  --   HCT 42.5 42.3 42.9  --   PLT 132* 140* 136*  --   HEPARINUNFRC 0.39 0.42  --  0.46  CREATININE 0.95  --  0.98  --     Assessment: 66 y/o male admitted with chest pain s/p cath which showed 2V CAD, severe LAD disease.  Heparin drip resumed post cath on 05/18/17.  Heparin level this AM 0.46, remains therapeutic on heparin drip rate 1600 units/hr.  H/H wnl/stable and PLTC  Low/stable 136k.  No bleeding noted. Patient scheduled for CABG today 10/26.   Goal of Therapy:  Heparin level 0.3-0.7 units/ml   Plan:   Continue IV heparin drip rate at 1600 units/hr, TCTS will dc on call to OR for CABG today.   Thank you for allowing pharmacy to be part of this patients care team. Noah Delaine, RPh Clinical Pharmacist Pager: [phone] 8a-330p [phone] 330p-1030p phone [phone] or [phone] Main pharmacy [phone] 05/22/2017 11:47 AM

## 2017-05-22 NOTE — Brief Op Note (Signed)
05/17/2017 - 05/22/2017  6:48 PM  PATIENT:  Noah Walker  66 y.o. male  PRE-OPERATIVE DIAGNOSIS:  CAD  POST-OPERATIVE DIAGNOSIS:  CAD PROCEDURE:  Procedure(s): CORONARY ARTERY BYPASS GRAFTING (CABG) (N/A) TRANSESOPHAGEAL ECHOCARDIOGRAM (TEE) (N/A) ENDOSCOPIC GREATER SAPHENOUS VEIN HARVEST (EVH) RIGHT THIGH LIMA-LAD SVG-RCA  SURGEON:  Surgeon(s) and Role:    Kerin Perna, MD - Primary  PHYSICIAN ASSISTANT: WAYNE GOLD PA-C  ANESTHESIA:   general  EBL:     BLOOD ADMINISTERED:none  DRAINS: 2 CHEST TUBES   LOCAL MEDICATIONS USED:  NONE  SPECIMEN:  No Specimen  DISPOSITION OF SPECIMEN:  N/A  COUNTS:  YES  TOURNIQUET:  * No tourniquets in log *  DICTATION: .Other Dictation: Dictation Number PENDING  PLAN OF CARE: Admit to inpatient   PATIENT DISPOSITION:  ICU - intubated and hemodynamically stable.   Delay start of Pharmacological VTE agent (>24hrs) due to surgical blood loss or risk of bleeding: yes  COMPLICATIONS: NO KNOWN

## 2017-05-22 NOTE — Progress Notes (Signed)
Held Lopressor, HR is 50. Order stated hold if less than 60

## 2017-05-22 NOTE — Progress Notes (Signed)
Rapid wean protocol initiated at this time in collaboration with RT. Will continue to closely monitor.  Herma Ard, RN

## 2017-05-22 NOTE — Progress Notes (Signed)
  Echocardiogram 2D Echocardiogram has been performed.  Noah Walker M 05/22/2017, 4:31 PM

## 2017-05-22 NOTE — Anesthesia Procedure Notes (Signed)
Arterial Line Insertion Start/End10/26/2018 2:00 PM, 05/22/2017 2:10 PM Performed by: Kelly Splinter B, CRNA  Preanesthetic checklist: patient identified, IV checked, site marked, risks and benefits discussed, surgical consent, monitors and equipment checked, pre-op evaluation and timeout performed Right, radial was placed Catheter size: 20 G Hand hygiene performed , maximum sterile barriers used  and Seldinger technique used Allen's test indicative of satisfactory collateral circulation Attempts: 1 Procedure performed without using ultrasound guided technique. Following insertion, Biopatch and dressing applied. Post procedure assessment: normal  Patient tolerated the procedure well with no immediate complications.

## 2017-05-22 NOTE — Care Management Important Message (Signed)
Important Message  Patient Details  Name: Noah Walker MRN: [id number] Date of Birth: March 26, 1951   Medicare Important Message Given:  Yes    Keyondra Lagrand 05/22/2017, 1:35 PM

## 2017-05-22 NOTE — Anesthesia Procedure Notes (Signed)
Procedure Name: Intubation Date/Time: 05/22/2017 4:01 PM Performed by: Oletta Lamas Pre-anesthesia Checklist: Patient identified, Emergency Drugs available, Suction available and Patient being monitored Patient Re-evaluated:Patient Re-evaluated prior to induction Oxygen Delivery Method: Circle System Utilized Preoxygenation: Pre-oxygenation with 100% oxygen Induction Type: IV induction Ventilation: Mask ventilation without difficulty Laryngoscope Size: Mac and 4 Grade View: Grade I Tube type: Oral Tube size: 7.5 mm Number of attempts: 1 Airway Equipment and Method: Stylet and Oral airway Placement Confirmation: ETT inserted through vocal cords under direct vision,  positive ETCO2 and breath sounds checked- equal and bilateral Secured at: 23 cm Tube secured with: Tape Dental Injury: Teeth and Oropharynx as per pre-operative assessment

## 2017-05-23 ENCOUNTER — Inpatient Hospital Stay (HOSPITAL_COMMUNITY): Payer: Medicare Other

## 2017-05-23 LAB — GLUCOSE, CAPILLARY
GLUCOSE-CAPILLARY: 104 mg/dL — AB (ref 65–99)
GLUCOSE-CAPILLARY: 105 mg/dL — AB (ref 65–99)
GLUCOSE-CAPILLARY: 113 mg/dL — AB (ref 65–99)
GLUCOSE-CAPILLARY: 125 mg/dL — AB (ref 65–99)
GLUCOSE-CAPILLARY: 125 mg/dL — AB (ref 65–99)
GLUCOSE-CAPILLARY: 130 mg/dL — AB (ref 65–99)
GLUCOSE-CAPILLARY: 130 mg/dL — AB (ref 65–99)
GLUCOSE-CAPILLARY: 137 mg/dL — AB (ref 65–99)
GLUCOSE-CAPILLARY: 158 mg/dL — AB (ref 65–99)
Glucose-Capillary: 114 mg/dL — ABNORMAL HIGH (ref 65–99)
Glucose-Capillary: 121 mg/dL — ABNORMAL HIGH (ref 65–99)
Glucose-Capillary: 133 mg/dL — ABNORMAL HIGH (ref 65–99)
Glucose-Capillary: 142 mg/dL — ABNORMAL HIGH (ref 65–99)

## 2017-05-23 LAB — BASIC METABOLIC PANEL
Anion gap: 7 (ref 5–15)
BUN: 9 mg/dL (ref 6–20)
CHLORIDE: 103 mmol/L (ref 101–111)
CO2: 24 mmol/L (ref 22–32)
Calcium: 8.1 mg/dL — ABNORMAL LOW (ref 8.9–10.3)
Creatinine, Ser: 0.85 mg/dL (ref 0.61–1.24)
GFR calc non Af Amer: >60 mL/min (ref >60)
Glucose, Bld: 126 mg/dL — ABNORMAL HIGH (ref 65–99)
POTASSIUM: 4.1 mmol/L (ref 3.5–5.1)
SODIUM: 134 mmol/L — AB (ref 135–145)

## 2017-05-23 LAB — CBC
HEMATOCRIT: 38.5 % — AB (ref 39.0–52.0)
HEMATOCRIT: 40.2 % (ref 39.0–52.0)
HEMOGLOBIN: 13 g/dL (ref 13.0–17.0)
Hemoglobin: 13.6 g/dL (ref 13.0–17.0)
MCH: 34 pg (ref 26.0–34.0)
MCH: 34.3 pg — ABNORMAL HIGH (ref 26.0–34.0)
MCHC: 33.8 g/dL (ref 30.0–36.0)
MCHC: 33.8 g/dL (ref 30.0–36.0)
MCV: 100.8 fL — ABNORMAL HIGH (ref 78.0–100.0)
MCV: 101.5 fL — AB (ref 78.0–100.0)
PLATELETS: 128 10*3/uL — AB (ref 150–400)
Platelets: 109 10*3/uL — ABNORMAL LOW (ref 150–400)
RBC: 3.82 MIL/uL — AB (ref 4.22–5.81)
RBC: 3.96 MIL/uL — ABNORMAL LOW (ref 4.22–5.81)
RDW: 12.7 % (ref 11.5–15.5)
RDW: 12.7 % (ref 11.5–15.5)
WBC: 12.7 10*3/uL — ABNORMAL HIGH (ref 4.0–10.5)
WBC: 16.7 10*3/uL — AB (ref 4.0–10.5)

## 2017-05-23 LAB — ECHO TEE
Annulus: 2.6 cm
Ao-desc: 2.9 cm
Ao-prox: 3.1
STJ: 3.1 cm
Sinus: 3.8 cm

## 2017-05-23 LAB — POCT I-STAT 3, ART BLOOD GAS (G3+)
Acid-Base Excess: 2 mmol/L (ref 0.0–2.0)
BICARBONATE: 25.7 mmol/L (ref 20.0–28.0)
BICARBONATE: 27.3 mmol/L (ref 20.0–28.0)
O2 Saturation: 98 %
O2 Saturation: 98 %
PCO2 ART: 43.3 mmHg (ref 32.0–48.0)
PCO2 ART: 43.8 mmHg (ref 32.0–48.0)
PH ART: 7.378 (ref 7.350–7.450)
PH ART: 7.407 (ref 7.350–7.450)
PO2 ART: 104 mmHg (ref 83.0–108.0)
PO2 ART: 97 mmHg (ref 83.0–108.0)
Patient temperature: 37
Patient temperature: 37
TCO2: 27 mmol/L (ref 22–32)
TCO2: 29 mmol/L (ref 22–32)

## 2017-05-23 LAB — POCT I-STAT, CHEM 8
BUN: 13 mg/dL (ref 6–20)
CALCIUM ION: 1.13 mmol/L — AB (ref 1.15–1.40)
CHLORIDE: 95 mmol/L — AB (ref 101–111)
CREATININE: 1.1 mg/dL (ref 0.61–1.24)
GLUCOSE: 128 mg/dL — AB (ref 65–99)
HCT: 38 % — ABNORMAL LOW (ref 39.0–52.0)
Hemoglobin: 12.9 g/dL — ABNORMAL LOW (ref 13.0–17.0)
POTASSIUM: 4.1 mmol/L (ref 3.5–5.1)
Sodium: 134 mmol/L — ABNORMAL LOW (ref 135–145)
TCO2: 30 mmol/L (ref 22–32)

## 2017-05-23 LAB — CREATININE, SERUM
Creatinine, Ser: 1.1 mg/dL (ref 0.61–1.24)
GFR calc Af Amer: >60 mL/min (ref >60)
GFR calc non Af Amer: >60 mL/min (ref >60)

## 2017-05-23 LAB — MAGNESIUM
Magnesium: 2.6 mg/dL — ABNORMAL HIGH (ref 1.7–2.4)
Magnesium: 2.2 mg/dL (ref 1.7–2.4)

## 2017-05-23 MED ORDER — ENOXAPARIN SODIUM 40 MG/0.4ML ~~LOC~~ SOLN
40.0000 mg | Freq: Every day | SUBCUTANEOUS | Status: DC
  Administered 2017-05-23 – 2017-05-26 (×4): 40 mg via SUBCUTANEOUS
  Filled 2017-05-23 (×4): qty 0.4

## 2017-05-23 MED ORDER — ORAL CARE MOUTH RINSE
15.0000 mL | Freq: Two times a day (BID) | OROMUCOSAL | Status: DC
  Administered 2017-05-23 – 2017-05-26 (×4): 15 mL via OROMUCOSAL

## 2017-05-23 MED ORDER — ROSUVASTATIN CALCIUM 10 MG PO TABS
10.0000 mg | ORAL_TABLET | Freq: Every day | ORAL | Status: DC
  Administered 2017-05-24: 10 mg via ORAL
  Filled 2017-05-23: qty 1

## 2017-05-23 MED ORDER — INSULIN ASPART 100 UNIT/ML ~~LOC~~ SOLN: 0.0000 [IU] | SUBCUTANEOUS | Status: DC

## 2017-05-23 MED ORDER — INSULIN ASPART 100 UNIT/ML ~~LOC~~ SOLN
0.0000 [IU] | SUBCUTANEOUS | Status: DC
  Administered 2017-05-23 – 2017-05-24 (×3): 2 [IU] via SUBCUTANEOUS

## 2017-05-23 NOTE — Plan of Care (Signed)
Problem: Pain Managment: Goal: General experience of comfort will improve Outcome: Progressing Patient resting peacefully on PO medications  Problem: Bowel/Gastric: Goal: Will not experience complications related to bowel motility Outcome: Progressing Patient had BM, small smear

## 2017-05-23 NOTE — Procedures (Signed)
Extubation Procedure Note  Patient Details:   Name: Noah Walker DOB: 1951-07-07 MRN: [id number]   Airway Documentation:     Evaluation  O2 sats: stable throughout Complications: No apparent complications Patient did tolerate procedure well. Bilateral Breath Sounds: Clear, Diminished   Yes   Patient extubated to [facility] without any complications. Patient able to speak and has an adequate cough.  Sharene Skeans 05/23/2017, 12:48 AM

## 2017-05-23 NOTE — Progress Notes (Signed)
Pt vent settings changed to CPAP/PS by RT per rapid wean protocol. Will continue to closely monitor pt.  Herma Ard, RN

## 2017-05-23 NOTE — Progress Notes (Signed)
Patient ID: Noah Walker, male   DOB: July 30, 1950, 66 y.o.   MRN: [id number] EVENING ROUNDS NOTE :     [address]       [place] [address]             [phone]                 1 Day Post-Op Procedure(s) (LRB): CORONARY ARTERY BYPASS GRAFTING (CABG) X 2 WITH ENDOSCOPIC HARVESTING OF RIGHT SAPHENOUS VEIN.LIMA-LAD SVG-RCA (N/A) TRANSESOPHAGEAL ECHOCARDIOGRAM (TEE) (N/A)  Total Length of Stay:  LOS: 5 days  BP (!) 124/92   Pulse 74   Temp 98.2 F (36.8 C) (Oral)   Resp (!) 24   Ht 6' (1.829 m)   Wt 217 lb 9.5 oz (98.7 kg)   SpO2 95%   BMI 29.51 kg/m   .Intake/Output      10/27 0701 - 10/28 0700   I.V. (mL/kg) 84 (0.9)   Total Intake(mL/kg) 84 (0.9)   Urine (mL/kg/hr) 685 (0.6)   Chest Tube 230   Total Output 915   Net -831         . sodium chloride 20 mL/hr at 05/23/17 0700  . sodium chloride    . sodium chloride Stopped (05/23/17 0000)  . cefUROXime (ZINACEF)  IV Stopped (05/23/17 1806)  . dexmedetomidine (PRECEDEX) IV infusion Stopped (05/22/17 2345)  . famotidine (PEPCID) IV Stopped (05/22/17 2115)  . lactated ringers Stopped (05/22/17 2045)  . lactated ringers 20 mL/hr at 05/23/17 0700  . nitroGLYCERIN Stopped (05/23/17 0805)  . phenylephrine (NEO-SYNEPHRINE) Adult infusion Stopped (05/23/17 0115)     Lab Results  Component Value Date   WBC 16.7 (H) 05/23/2017   HGB 13.6 05/23/2017   HCT 40.2 05/23/2017   PLT 128 (L) 05/23/2017   GLUCOSE 128 (H) 05/23/2017   CHOL 192 05/19/2017   TRIG 168 (H) 05/19/2017   HDL 41 05/19/2017   LDLCALC 117 (H) 05/19/2017   ALT 20 05/20/2017   AST 17 05/20/2017   NA 134 (L) 05/23/2017   K 4.1 05/23/2017   CL 95 (L) 05/23/2017   CREATININE 1.10 05/23/2017   BUN 13 05/23/2017   CO2 24 05/23/2017   TSH 3.664 05/20/2017   INR 1.21 05/22/2017   HGBA1C 4.9 05/19/2017   Stable day Ambulated in hall On crestor says he is sensitive to statins make him hurt and have bad dreams, discussed with patient  will decrease dose then decide what to take at home   Delight Ovens MD  Beeper [phone] Office [phone] 05/23/2017 7:14 PM

## 2017-05-23 NOTE — Progress Notes (Signed)
NIF -25, VC 1.1L

## 2017-05-23 NOTE — Progress Notes (Signed)
Patient ID: Noah Walker, male   DOB: 1951-02-09, 66 y.o.   MRN: [id number] TCTS DAILY ICU PROGRESS NOTE                   [address]            [place] [address]          [phone]   1 Day Post-Op Procedure(s) (LRB): CORONARY ARTERY BYPASS GRAFTING (CABG) X 2 WITH ENDOSCOPIC HARVESTING OF RIGHT SAPHENOUS VEIN.LIMA-LAD SVG-RCA (N/A) TRANSESOPHAGEAL ECHOCARDIOGRAM (TEE) (N/A)  Total Length of Stay:  LOS: 5 days   Subjective: Patient awake and neurologically intact extubated last night  Objective: Vital signs in last 24 hours: Temp:  [96.6 F (35.9 C)-99 F (37.2 C)] 98.4 F (36.9 C) (10/27 0815) Pulse Rate:  [59-82] 73 (10/27 0815) Cardiac Rhythm: Normal sinus rhythm (10/27 0815) Resp:  [12-34] 19 (10/27 0815) BP: (97-139)/(66-87) 122/75 (10/27 0815) SpO2:  [96 %-100 %] 96 % (10/27 0815) Arterial Line BP: (83-141)/(52-85) 129/60 (10/27 0815) FiO2 (%):  [40 %] 40 % (10/27 0000) Weight:  [210 lb (95.3 kg)-217 lb 9.5 oz (98.7 kg)] 217 lb 9.5 oz (98.7 kg) (10/27 0600)  Filed Weights   05/22/17 0603 05/22/17 1327 05/23/17 0600  Weight: 210 lb 3.2 oz (95.3 kg) 210 lb (95.3 kg) 217 lb 9.5 oz (98.7 kg)    Weight change: -3.2 oz (-0.091 kg)   Hemodynamic parameters for last 24 hours: PAP: (17-47)/(1-27) 29/1 CO:  [4.5 L/min-8.2 L/min] 8.2 L/min CI:  [2 L/min/m2-3.8 L/min/m2] 3.8 L/min/m2  Intake/Output from previous day: 10/26 0701 - 10/27 0700 In: 4647.8 [I.V.:3357.8; Blood:390; IV Piggyback:900] Out: 2420 [Urine:1810; Emesis/NG output:40; Blood:200; Chest Tube:370]  Intake/Output this shift: Total I/O In: 44 [I.V.:44] Out: 290 [Urine:240; Chest Tube:50]  Current Meds: Scheduled Meds: . acetaminophen  1,000 mg Oral Q6H   Or  . acetaminophen (TYLENOL) oral liquid 160 mg/5 mL  1,000 mg Per Tube Q6H  . aspirin EC  325 mg Oral Daily   Or  . aspirin  324 mg Per Tube Daily  . bisacodyl  10 mg Oral Daily   Or  . bisacodyl  10 mg Rectal Daily  .  Chlorhexidine Gluconate Cloth  6 each Topical Daily  . docusate sodium  200 mg Oral Daily  . insulin regular  0-10 Units Intravenous TID WC  . mouth rinse  15 mL Mouth Rinse BID  . metoprolol tartrate  12.5 mg Oral BID   Or  . metoprolol tartrate  12.5 mg Per Tube BID  . [START ON 05/24/2017] pantoprazole  40 mg Oral Daily  . rosuvastatin  40 mg Oral q1800  . sodium chloride flush  10-40 mL Intracatheter Q12H  . sodium chloride flush  3 mL Intravenous Q12H   Continuous Infusions: . sodium chloride 20 mL/hr at 05/23/17 0700  . sodium chloride    . sodium chloride Stopped (05/23/17 0000)  . albumin human Stopped (05/23/17 0000)  . cefUROXime (ZINACEF)  IV Stopped (05/23/17 0730)  . dexmedetomidine (PRECEDEX) IV infusion Stopped (05/22/17 2345)  . famotidine (PEPCID) IV Stopped (05/22/17 2115)  . insulin (NOVOLIN-R) infusion 1.8 Units/hr (05/23/17 0700)  . lactated ringers Stopped (05/22/17 2045)  . lactated ringers 20 mL/hr at 05/23/17 0700  . nitroGLYCERIN Stopped (05/23/17 0805)  . phenylephrine (NEO-SYNEPHRINE) Adult infusion Stopped (05/23/17 0115)   PRN Meds:.sodium chloride, albumin human, metoprolol tartrate, midazolam, morphine injection, ondansetron (ZOFRAN) IV, oxyCODONE, sodium chloride flush, sodium chloride flush, traMADol  General appearance: alert, cooperative and no distress Neurologic: intact Heart: regular rate and rhythm, S1, S2 normal, no murmur, click, rub or gallop Lungs: diminished breath sounds bibasilar Abdomen: soft, non-tender; bowel sounds normal; no masses,  no organomegaly Extremities: extremities normal, atraumatic, no cyanosis or edema and Homans sign is negative, no sign of DVT Wound: Sternum stable  Lab Results: CBC: Recent Labs  05/22/17 2032 05/23/17 0258  WBC 11.2* 12.7*  HGB 12.8* 13.0  HCT 38.0* 38.5*  PLT 89* 109*   BMET:  Recent Labs  05/21/17 1755  05/22/17 1940 05/23/17 0258  NA 136  < > 135 134*  K 3.9  < > 4.5 4.1  CL  104  < > 99* 103  CO2 23  --   --  24  GLUCOSE 105*  < > 149* 126*  BUN 9  < > 10 9  CREATININE 0.98  < > 0.70 0.85  CALCIUM 9.2  --   --  8.1*  < > = values in this interval not displayed.  CMET: Lab Results  Component Value Date   WBC 12.7 (H) 05/23/2017   HGB 13.0 05/23/2017   HCT 38.5 (L) 05/23/2017   PLT 109 (L) 05/23/2017   GLUCOSE 126 (H) 05/23/2017   CHOL 192 05/19/2017   TRIG 168 (H) 05/19/2017   HDL 41 05/19/2017   LDLCALC 117 (H) 05/19/2017   ALT 20 05/20/2017   AST 17 05/20/2017   NA 134 (L) 05/23/2017   K 4.1 05/23/2017   CL 103 05/23/2017   CREATININE 0.85 05/23/2017   BUN 9 05/23/2017   CO2 24 05/23/2017   TSH 3.664 05/20/2017   INR 1.21 05/22/2017   HGBA1C 4.9 05/19/2017      PT/INR:  Recent Labs  05/22/17 2032  LABPROT 15.2  INR 1.21   Radiology: Dg Chest Port 1 View  Result Date: 05/23/2017 CLINICAL DATA:  Status post CABG. EXAM: PORTABLE CHEST 1 VIEW COMPARISON:  05/22/2017 FINDINGS: Sequelae of CABG are again identified. Endotracheal and enteric tubes have been removed. Right jugular Swan-Ganz catheter remains in place with tip in the region of the pulmonary outflow tract, unchanged. Mediastinal drain and left chest tube remain in place. The cardiomediastinal silhouette is unchanged. Lung volumes remain diminished with left greater than right basilar opacities likely reflecting atelectasis and mildly increased on the right. No large pleural effusion or pneumothorax is identified. IMPRESSION: Interval extubation. Low lung volumes with bibasilar atelectasis, slightly increased on the right. Electronically Signed   By: Sebastian AcheAllen  Grady M.D.   On: 05/23/2017 07:36   Dg Chest Port 1 View  Result Date: 05/22/2017 CLINICAL DATA:  Status post CABG. EXAM: PORTABLE CHEST 1 VIEW COMPARISON:  05/17/2017. FINDINGS: Interval post CABG changes with mediastinal and left chest tubes. No pneumothorax. Right jugular Swan-Ganz catheter tip in the distal pulmonary outflow  tract. Nasogastric tube extending into the stomach. Endotracheal tube in satisfactory position. Mild left basilar atelectasis and minimal right basilar atelectasis. Unremarkable bones. IMPRESSION: Status post CABG with mild left basilar atelectasis and minimal right basilar atelectasis. Electronically Signed   By: Beckie SaltsSteven  Reid M.D.   On: 05/22/2017 21:32   I have independently reviewed the above radiology studies  and reviewed the findings with the patient.    Assessment/Plan: S/P Procedure(s) (LRB): CORONARY ARTERY BYPASS GRAFTING (CABG) X 2 WITH ENDOSCOPIC HARVESTING OF RIGHT SAPHENOUS VEIN.LIMA-LAD SVG-RCA (N/A) TRANSESOPHAGEAL ECHOCARDIOGRAM (TEE) (N/A) Mobilize Diuresis d/c tubes/lines See progression orders Expected Acute  Blood - loss Anemia- continue  to monitor      Delight Ovens 05/23/2017 9:40 AM

## 2017-05-23 NOTE — Anesthesia Postprocedure Evaluation (Signed)
Anesthesia Post Note  Patient: Noah Walker  Procedure(s) Performed: CORONARY ARTERY BYPASS GRAFTING (CABG) X 2 WITH ENDOSCOPIC HARVESTING OF RIGHT SAPHENOUS VEIN.LIMA-LAD SVG-RCA (N/A Chest) TRANSESOPHAGEAL ECHOCARDIOGRAM (TEE) (N/A )     Patient location during evaluation: SICU Anesthesia Type: General Level of consciousness: sedated Pain management: pain level controlled Vital Signs Assessment: post-procedure vital signs reviewed and stable Respiratory status: patient remains intubated per anesthesia plan Cardiovascular status: stable Postop Assessment: no apparent nausea or vomiting Anesthetic complications: no    Last Vitals:  Vitals:   05/23/17 0000 05/23/17 0015  BP: 97/66   Pulse: 70 69  Resp: 19 17  Temp: 36.9 C 36.9 C  SpO2: 100% 99%    Last Pain:  Vitals:   05/23/17 0000  TempSrc: Core (Comment)  PainSc:                  Lasean Gorniak DAVID

## 2017-05-24 ENCOUNTER — Inpatient Hospital Stay (HOSPITAL_COMMUNITY): Payer: Medicare Other

## 2017-05-24 LAB — CBC
HCT: 35.5 % — ABNORMAL LOW (ref 39.0–52.0)
Hemoglobin: 12 g/dL — ABNORMAL LOW (ref 13.0–17.0)
MCH: 34 pg (ref 26.0–34.0)
MCHC: 33.8 g/dL (ref 30.0–36.0)
MCV: 100.6 fL — ABNORMAL HIGH (ref 78.0–100.0)
Platelets: 112 10*3/uL — ABNORMAL LOW (ref 150–400)
RBC: 3.53 MIL/uL — ABNORMAL LOW (ref 4.22–5.81)
RDW: 12.8 % (ref 11.5–15.5)
WBC: 13.9 10*3/uL — ABNORMAL HIGH (ref 4.0–10.5)

## 2017-05-24 LAB — BASIC METABOLIC PANEL
Anion gap: 6 (ref 5–15)
BUN: 11 mg/dL (ref 6–20)
CO2: 28 mmol/L (ref 22–32)
Calcium: 8.3 mg/dL — ABNORMAL LOW (ref 8.9–10.3)
Chloride: 96 mmol/L — ABNORMAL LOW (ref 101–111)
Creatinine, Ser: 0.94 mg/dL (ref 0.61–1.24)
GFR calc Af Amer: >60 mL/min (ref >60)
GFR calc non Af Amer: >60 mL/min (ref >60)
Glucose, Bld: 107 mg/dL — ABNORMAL HIGH (ref 65–99)
Potassium: 4 mmol/L (ref 3.5–5.1)
Sodium: 130 mmol/L — ABNORMAL LOW (ref 135–145)

## 2017-05-24 LAB — GLUCOSE, CAPILLARY
GLUCOSE-CAPILLARY: 109 mg/dL — AB (ref 65–99)
GLUCOSE-CAPILLARY: 111 mg/dL — AB (ref 65–99)
GLUCOSE-CAPILLARY: 128 mg/dL — AB (ref 65–99)
Glucose-Capillary: 114 mg/dL — ABNORMAL HIGH (ref 65–99)

## 2017-05-24 MED ORDER — SODIUM CHLORIDE 0.9% FLUSH
3.0000 mL | Freq: Two times a day (BID) | INTRAVENOUS | Status: DC
  Administered 2017-05-24 – 2017-05-26 (×5): 3 mL via INTRAVENOUS

## 2017-05-24 MED ORDER — ONDANSETRON HCL 4 MG/2ML IJ SOLN
4.0000 mg | Freq: Four times a day (QID) | INTRAMUSCULAR | Status: DC | PRN
  Administered 2017-05-24: 4 mg via INTRAVENOUS
  Filled 2017-05-24: qty 2

## 2017-05-24 MED ORDER — TRAMADOL HCL 50 MG PO TABS
50.0000 mg | ORAL_TABLET | ORAL | Status: DC | PRN
  Administered 2017-05-24 – 2017-05-25 (×2): 100 mg via ORAL
  Administered 2017-05-26: 50 mg via ORAL
  Filled 2017-05-24: qty 1
  Filled 2017-05-24 (×2): qty 2

## 2017-05-24 MED ORDER — INSULIN ASPART 100 UNIT/ML ~~LOC~~ SOLN
0.0000 [IU] | Freq: Three times a day (TID) | SUBCUTANEOUS | Status: DC
  Administered 2017-05-24 (×2): 2 [IU] via SUBCUTANEOUS

## 2017-05-24 MED ORDER — BISACODYL 5 MG PO TBEC: 10.0000 mg | DELAYED_RELEASE_TABLET | Freq: Every day | ORAL | Status: DC | PRN

## 2017-05-24 MED ORDER — DOCUSATE SODIUM 100 MG PO CAPS
200.0000 mg | ORAL_CAPSULE | Freq: Every day | ORAL | Status: DC
  Administered 2017-05-25 – 2017-05-27 (×3): 200 mg via ORAL
  Filled 2017-05-24 (×3): qty 2

## 2017-05-24 MED ORDER — ACETAMINOPHEN 325 MG PO TABS
650.0000 mg | ORAL_TABLET | Freq: Four times a day (QID) | ORAL | Status: DC | PRN
  Administered 2017-05-24 – 2017-05-27 (×8): 650 mg via ORAL
  Filled 2017-05-24 (×8): qty 2

## 2017-05-24 MED ORDER — ONDANSETRON HCL 4 MG PO TABS
4.0000 mg | ORAL_TABLET | Freq: Four times a day (QID) | ORAL | Status: DC | PRN
  Administered 2017-05-25: 4 mg via ORAL
  Filled 2017-05-24: qty 1

## 2017-05-24 MED ORDER — ALPRAZOLAM 0.25 MG PO TABS
0.2500 mg | ORAL_TABLET | Freq: Two times a day (BID) | ORAL | Status: DC | PRN
  Administered 2017-05-24 (×2): 0.25 mg via ORAL
  Filled 2017-05-24: qty 1

## 2017-05-24 MED ORDER — BISACODYL 10 MG RE SUPP: 10.0000 mg | Freq: Every day | RECTAL | Status: DC | PRN

## 2017-05-24 MED ORDER — SODIUM CHLORIDE 0.9 % IV SOLN: 250.0000 mL | INTRAVENOUS | Status: DC | PRN

## 2017-05-24 MED ORDER — MOVING RIGHT ALONG BOOK
Freq: Once | Status: AC
  Administered 2017-05-24: 12:00:00
  Filled 2017-05-24: qty 1

## 2017-05-24 MED ORDER — FOLIC ACID 1 MG PO TABS
1.0000 mg | ORAL_TABLET | Freq: Every day | ORAL | Status: DC
  Administered 2017-05-24 – 2017-05-27 (×4): 1 mg via ORAL
  Filled 2017-05-24 (×4): qty 1

## 2017-05-24 MED ORDER — OXYCODONE HCL 5 MG PO TABS
5.0000 mg | ORAL_TABLET | ORAL | Status: DC | PRN
  Administered 2017-05-24 (×2): 10 mg via ORAL
  Filled 2017-05-24 (×3): qty 2

## 2017-05-24 MED ORDER — SODIUM CHLORIDE 0.9% FLUSH: 3.0000 mL | INTRAVENOUS | Status: DC | PRN

## 2017-05-24 MED ORDER — PANTOPRAZOLE SODIUM 40 MG PO TBEC
40.0000 mg | DELAYED_RELEASE_TABLET | Freq: Every day | ORAL | Status: DC
Start: 2017-05-25 — End: 2017-05-27
  Administered 2017-05-25 – 2017-05-27 (×3): 40 mg via ORAL
  Filled 2017-05-24 (×3): qty 1

## 2017-05-24 MED ORDER — METOPROLOL TARTRATE 12.5 MG HALF TABLET
12.5000 mg | ORAL_TABLET | Freq: Two times a day (BID) | ORAL | Status: DC
  Administered 2017-05-24 – 2017-05-25 (×2): 12.5 mg via ORAL
  Filled 2017-05-24 (×3): qty 1

## 2017-05-24 MED ORDER — ASPIRIN EC 81 MG PO TBEC
81.0000 mg | DELAYED_RELEASE_TABLET | Freq: Every day | ORAL | Status: DC
  Administered 2017-05-25 – 2017-05-27 (×3): 81 mg via ORAL
  Filled 2017-05-24 (×3): qty 1

## 2017-05-24 NOTE — Progress Notes (Addendum)
Patient ID: Noah Walker, male   DOB: [birth year], 66 y.o.   MRN: [id number] TCTS DAILY ICU PROGRESS NOTE                   [address]            [place] [address]          [phone]   2 Days Post-Op Procedure(s) (LRB): CORONARY ARTERY BYPASS GRAFTING (CABG) X 2 WITH ENDOSCOPIC HARVESTING OF RIGHT SAPHENOUS VEIN.LIMA-LAD SVG-RCA (N/A) TRANSESOPHAGEAL ECHOCARDIOGRAM (TEE) (N/A)  Total Length of Stay:  LOS: 6 days   Subjective: Patient feels better this morning ambulates without difficulty Objective: Vital signs in last 24 hours: Temp:  [98 F (36.7 C)-98.8 F (37.1 C)] 98.4 F (36.9 C) (10/28 0700) Pulse Rate:  [63-78] 73 (10/28 0700) Cardiac Rhythm: Normal sinus rhythm (10/28 0400) Resp:  [16-30] 29 (10/28 0700) BP: (101-126)/(64-92) 119/84 (10/28 0600) SpO2:  [86 %-99 %] 92 % (10/28 0700) Arterial Line BP: (130)/(62) 130/62 (10/27 1030) Weight:  [220 lb (99.8 kg)] 220 lb (99.8 kg) (10/28 0500)  Filed Weights   05/22/17 1327 05/23/17 0600 05/24/17 0500  Weight: 210 lb (95.3 kg) 217 lb 9.5 oz (98.7 kg) 220 lb (99.8 kg)    Weight change: 10 lb (4.536 kg)   Hemodynamic parameters for last 24 hours: PAP: (31)/(3) 31/3  Intake/Output from previous day: 10/27 0701 - 10/28 0700 In: 644 [I.V.:544; IV Piggyback:100] Out: 1775 [Urine:1355; Chest Tube:420]  Intake/Output this shift: Total I/O In: 20 [I.V.:20] Out: 110 [Urine:100; Chest Tube:10]  Current Meds: Scheduled Meds: . acetaminophen  1,000 mg Oral Q6H   Or  . acetaminophen (TYLENOL) oral liquid 160 mg/5 mL  1,000 mg Per Tube Q6H  . aspirin EC  325 mg Oral Daily   Or  . aspirin  324 mg Per Tube Daily  . bisacodyl  10 mg Oral Daily   Or  . bisacodyl  10 mg Rectal Daily  . Chlorhexidine Gluconate Cloth  6 each Topical Daily  . docusate sodium  200 mg Oral Daily  . enoxaparin (LOVENOX) injection  40 mg Subcutaneous QHS  . insulin aspart  0-24 Units Subcutaneous Q4H  . mouth rinse  15 mL  Mouth Rinse BID  . metoprolol tartrate  12.5 mg Oral BID   Or  . metoprolol tartrate  12.5 mg Per Tube BID  . pantoprazole  40 mg Oral Daily  . rosuvastatin  10 mg Oral q1800  . sodium chloride flush  10-40 mL Intracatheter Q12H  . sodium chloride flush  3 mL Intravenous Q12H   Continuous Infusions: . sodium chloride 20 mL/hr at 05/23/17 2000  . sodium chloride    . sodium chloride Stopped (05/23/17 0000)  . cefUROXime (ZINACEF)  IV Stopped (05/24/17 0719)  . dexmedetomidine (PRECEDEX) IV infusion Stopped (05/22/17 2345)  . lactated ringers Stopped (05/22/17 2045)  . lactated ringers 20 mL/hr at 05/24/17 0700  . nitroGLYCERIN Stopped (05/23/17 0805)  . phenylephrine (NEO-SYNEPHRINE) Adult infusion Stopped (05/23/17 0115)   PRN Meds:.sodium chloride, metoprolol tartrate, midazolam, morphine injection, ondansetron (ZOFRAN) IV, oxyCODONE, sodium chloride flush, sodium chloride flush, traMADol  General appearance: alert, cooperative and no distress Neurologic: intact Heart: regular rate and rhythm, S1, S2 normal, no murmur, click, rub or gallop Lungs: clear to auscultation bilaterally Abdomen: soft, non-tender; bowel sounds normal; no masses,  no organomegaly Extremities: extremities normal, atraumatic, no cyanosis or edema and Homans sign is negative, no sign of DVT Wound:  Sternum stable  Lab Results: CBC: Recent Labs  05/23/17 1640 05/24/17 0315  WBC 16.7* 13.9*  HGB 13.6 12.0*  HCT 40.2 35.5*  PLT 128* 112*   BMET:  Recent Labs  05/23/17 0258 05/23/17 1632 05/23/17 1640 05/24/17 0315  NA 134* 134*  --  130*  K 4.1 4.1  --  4.0  CL 103 95*  --  96*  CO2 24  --   --  28  GLUCOSE 126* 128*  --  107*  BUN 9 13  --  11  CREATININE 0.85 1.10 1.10 0.94  CALCIUM 8.1*  --   --  8.3*    CMET: Lab Results  Component Value Date   WBC 13.9 (H) 05/24/2017   HGB 12.0 (L) 05/24/2017   HCT 35.5 (L) 05/24/2017   PLT 112 (L) 05/24/2017   GLUCOSE 107 (H) 05/24/2017   CHOL  192 05/19/2017   TRIG 168 (H) 05/19/2017   HDL 41 05/19/2017   LDLCALC 117 (H) 05/19/2017   ALT 20 05/20/2017   AST 17 05/20/2017   NA 130 (L) 05/24/2017   K 4.0 05/24/2017   CL 96 (L) 05/24/2017   CREATININE 0.94 05/24/2017   BUN 11 05/24/2017   CO2 28 05/24/2017   TSH 3.664 05/20/2017   INR 1.21 05/22/2017   HGBA1C 4.9 05/19/2017      PT/INR:  Recent Labs  05/22/17 2032  LABPROT 15.2  INR 1.21   Radiology: Dg Chest Port 1 View  Result Date: 05/24/2017 CLINICAL DATA:  Postop check heart sx EXAM: PORTABLE CHEST 1 VIEW COMPARISON:  05/23/2017 FINDINGS: Swan-Ganz catheter has been removed. Right IJ sheath remains in place, tip overlying the level superior vena cava. Mediastinal drain has been removed. A left-sided chest tube remains in place. There is no evidence for pneumothorax. Minimal bibasilar atelectasis is present. Suspect trace left pleural effusion. There is mild gaseous distension of the stomach. IMPRESSION: 1. Interval removal of Swan-Ganz catheter and mediastinal drain. 2. Persistent left-sided chest tube without evidence for pneumothorax. Electronically Signed   By: Norva PavlovElizabeth  Brown M.D.   On: 05/24/2017 07:20     Assessment/Plan: S/P Procedure(s) (LRB): CORONARY ARTERY BYPASS GRAFTING (CABG) X 2 WITH ENDOSCOPIC HARVESTING OF RIGHT SAPHENOUS VEIN.LIMA-LAD SVG-RCA (N/A) TRANSESOPHAGEAL ECHOCARDIOGRAM (TEE) (N/A) Mobilize Diuresis d/c tubes/lines Plan for transfer to step-down: see transfer orders DC remaining chest tube and Foley Expected Acute  Blood - loss Anemia- continue to monitor  Stable renal function    Delight Ovensdward B Angelus Hoopes 05/24/2017 9:19 AM

## 2017-05-24 NOTE — Progress Notes (Signed)
Anesthesiology Follow-up:  Awake and alert,  Neuro intact,  in good spirits, sitting in chair, walked with assistance yesterday.  VS: T-36.9 BP- 119/84 HR-67 (SR)  RR- 19 O2 Sat- 95% on 4L [place]  K-4.0 BUN/Cr- 11/0.94 glucose- 94 H/H- 12.0/35.5 Platelets- 112,000  Extubated 4 hours post-op  66 year old male 2 days S/P CABG x 2. Stable post-op course, no apparent complications.

## 2017-05-25 ENCOUNTER — Encounter (HOSPITAL_COMMUNITY): Payer: Self-pay | Admitting: Cardiothoracic Surgery

## 2017-05-25 ENCOUNTER — Inpatient Hospital Stay (HOSPITAL_COMMUNITY): Payer: Medicare Other

## 2017-05-25 LAB — POCT I-STAT 4, (NA,K, GLUC, HGB,HCT)
Glucose, Bld: 130 mg/dL — ABNORMAL HIGH (ref 65–99)
HCT: 37 % — ABNORMAL LOW (ref 39.0–52.0)
HEMOGLOBIN: 12.6 g/dL — AB (ref 13.0–17.0)
Potassium: 4.5 mmol/L (ref 3.5–5.1)
Sodium: 137 mmol/L (ref 135–145)

## 2017-05-25 LAB — BPAM RBC
Blood Product Expiration Date: 201811052359
Blood Product Expiration Date: 201811052359
Unit Type and Rh: 9500
Unit Type and Rh: 9500

## 2017-05-25 LAB — TYPE AND SCREEN
ABO/RH(D): O NEG
Antibody Screen: NEGATIVE
Unit division: 0
Unit division: 0

## 2017-05-25 LAB — CBC
HCT: 32.7 % — ABNORMAL LOW (ref 39.0–52.0)
Hemoglobin: 11.1 g/dL — ABNORMAL LOW (ref 13.0–17.0)
MCH: 34 pg (ref 26.0–34.0)
MCHC: 33.9 g/dL (ref 30.0–36.0)
MCV: 100.3 fL — ABNORMAL HIGH (ref 78.0–100.0)
Platelets: 114 10*3/uL — ABNORMAL LOW (ref 150–400)
RBC: 3.26 MIL/uL — ABNORMAL LOW (ref 4.22–5.81)
RDW: 12.5 % (ref 11.5–15.5)
WBC: 11.1 10*3/uL — ABNORMAL HIGH (ref 4.0–10.5)

## 2017-05-25 LAB — BASIC METABOLIC PANEL
Anion gap: 7 (ref 5–15)
BUN: 8 mg/dL (ref 6–20)
CO2: 28 mmol/L (ref 22–32)
Calcium: 8.2 mg/dL — ABNORMAL LOW (ref 8.9–10.3)
Chloride: 96 mmol/L — ABNORMAL LOW (ref 101–111)
Creatinine, Ser: 0.94 mg/dL (ref 0.61–1.24)
GFR calc Af Amer: >60 mL/min (ref >60)
GFR calc non Af Amer: >60 mL/min (ref >60)
Glucose, Bld: 105 mg/dL — ABNORMAL HIGH (ref 65–99)
Potassium: 4.2 mmol/L (ref 3.5–5.1)
Sodium: 131 mmol/L — ABNORMAL LOW (ref 135–145)

## 2017-05-25 LAB — GLUCOSE, CAPILLARY
GLUCOSE-CAPILLARY: 119 mg/dL — AB (ref 65–99)
Glucose-Capillary: 134 mg/dL — ABNORMAL HIGH (ref 65–99)

## 2017-05-25 MED ORDER — FUROSEMIDE 40 MG PO TABS
40.0000 mg | ORAL_TABLET | Freq: Every day | ORAL | Status: DC
Start: 2017-05-26 — End: 2017-05-27
  Administered 2017-05-26 – 2017-05-27 (×2): 40 mg via ORAL
  Filled 2017-05-25 (×3): qty 1

## 2017-05-25 MED ORDER — ZOLPIDEM TARTRATE 5 MG PO TABS
5.0000 mg | ORAL_TABLET | Freq: Every day | ORAL | Status: DC
  Administered 2017-05-25: 5 mg via ORAL
  Filled 2017-05-25: qty 1

## 2017-05-25 MED ORDER — POTASSIUM CHLORIDE CRYS ER 20 MEQ PO TBCR
20.0000 meq | EXTENDED_RELEASE_TABLET | Freq: Every day | ORAL | Status: DC
  Administered 2017-05-25 – 2017-05-27 (×3): 20 meq via ORAL
  Filled 2017-05-25 (×3): qty 1

## 2017-05-25 MED ORDER — LISINOPRIL 5 MG PO TABS
5.0000 mg | ORAL_TABLET | Freq: Every day | ORAL | Status: DC
  Administered 2017-05-25: 5 mg via ORAL
  Filled 2017-05-25: qty 1

## 2017-05-25 MED ORDER — FUROSEMIDE 10 MG/ML IJ SOLN
40.0000 mg | Freq: Once | INTRAMUSCULAR | Status: AC
  Administered 2017-05-25: 40 mg via INTRAVENOUS
  Filled 2017-05-25: qty 4

## 2017-05-25 MED FILL — Magnesium Sulfate Inj 50%: INTRAMUSCULAR | Qty: 10 | Status: AC

## 2017-05-25 MED FILL — Heparin Sodium (Porcine) Inj 1000 Unit/ML: INTRAMUSCULAR | Qty: 30 | Status: AC

## 2017-05-25 MED FILL — Potassium Chloride Inj 2 mEq/ML: INTRAVENOUS | Qty: 40 | Status: AC

## 2017-05-25 NOTE — Progress Notes (Addendum)
[address]       [place] [address]             [phone]      3 Days Post-Op Procedure(s) (LRB): CORONARY ARTERY BYPASS GRAFTING (CABG) X 2 WITH ENDOSCOPIC HARVESTING OF RIGHT SAPHENOUS VEIN.LIMA-LAD SVG-RCA (N/A) TRANSESOPHAGEAL ECHOCARDIOGRAM (TEE) (N/A)   Subjective:  Noah Walker has multiple complaints.  His wife is present and states patient did really well the first 2 days and now he just feels horrible.  He is not sleeping but maybe minutes to hour at a time.  He states he gets like this when he is on a statin.  He does not want to take Noah Walker and uses fish oil at home.  He has not walked yet today.  He does not want cough because it hurts  Objective: Vital signs in last 24 hours: Temp:  [98.1 F (36.7 C)-99.1 F (37.3 C)] 99.1 F (37.3 C) (10/29 6122) Pulse Rate:  [66-86] 78 (10/29 0700) Cardiac Rhythm: Normal sinus rhythm (10/28 2155) Resp:  [18-33] 24 (10/29 0700) BP: (113-147)/(65-94) 126/69 (10/29 0632) SpO2:  [90 %-98 %] 92 % (10/29 0700) Weight:  [217 lb (98.4 kg)] 217 lb (98.4 kg) (10/29 4497)  Intake/Output from previous day: 10/28 0701 - 10/29 0700 In: 140 [P.O.:120; I.V.:20] Out: 1865 [Urine:1835; Chest Tube:30]  General appearance: alert, cooperative and no distress Heart: regular rate and rhythm Lungs: coarse bilaterally Abdomen: soft, non-tender; bowel sounds normal; no masses,  no organomegaly Extremities: edema trace Wound: clean and dry  Lab Results:  Recent Labs  05/24/17 0315 05/25/17 0555  WBC 13.9* 11.1*  HGB 12.0* 11.1*  HCT 35.5* 32.7*  PLT 112* 114*   BMET:  Recent Labs  05/24/17 0315 05/25/17 0555  NA 130* 131*  K 4.0 4.2  CL 96* 96*  CO2 28 28  GLUCOSE 107* 105*  BUN 11 8  CREATININE 0.94 0.94  CALCIUM 8.3* 8.2*    PT/INR:  Recent Labs  05/22/17 2032  LABPROT 15.2  INR 1.21   ABG    Component Value Date/Time   PHART 7.407 05/23/2017 0153   HCO3 27.3 05/23/2017 0153   TCO2 30  05/23/2017 1632   ACIDBASEDEF 1.0 05/22/2017 1932   O2SAT 98.0 05/23/2017 0153   CBG (last 3)   Recent Labs  05/24/17 1156 05/24/17 2217 05/25/17 0624  GLUCAP 128* 114* 119*    Assessment/Plan: S/P Procedure(s) (LRB): CORONARY ARTERY BYPASS GRAFTING (CABG) X 2 WITH ENDOSCOPIC HARVESTING OF RIGHT SAPHENOUS VEIN.LIMA-LAD SVG-RCA (N/A) TRANSESOPHAGEAL ECHOCARDIOGRAM (TEE) (N/A)  1. CV- NSR, mild HTN- continue Noah Walker, will start low dose ACE for better BP control 2. Pulm- no acute issues, continue IS.Marland Kitchen CXR with bilateral atelectasis, minimal effusions 3. Renal- creatinine has been stable, weight is trending down, with poor lungs will give diuretics today 4. Expected post operative blood loss anemia-mild Hgb at 11.1 5. CBGs controlled- patient not a diabetic will d/c SSIP and glucose checks 6. Dispo- patient not feeling great today, will d/c Noah Walker as patient states makes him have flu like symptoms and he wises for this to be discontinued, will d/c IV in right hand as he states he keeps getting it caught and it pulls, will diurese today, patient having insomnia.. Will try Noah Walker as this has worked for him in the past, continue current care   LOS: 7 days    BARRETT, ERIN 05/25/2017  Patient feeling better this afternoon Better appetite and able to ambulate  Plan discharge later this week, probably Wednesday  patient examined and medical record reviewed,agree with above note. Kathlee Nationseter Van Trigt III 05/25/2017

## 2017-05-25 NOTE — Progress Notes (Signed)
1330 Checked with pt's RN. Pt asleep now. She stated she will walk with him later when he is awake. Will let pt sleep now and continue to follow. Luetta Nutting RN BSN 05/25/2017 1:33 PM

## 2017-05-25 NOTE — Progress Notes (Signed)
This RN offered to ambulate patient twice patient refusing to ambulate, said he walked in the hall at [place], will continue to educate patient on the need to ambulate and continue to monitor.

## 2017-05-25 NOTE — Progress Notes (Signed)
Progress Note  Patient Name: Noah Walker Date of Encounter: 05/25/2017  Primary Cardiologist: Revankar  Subjective   No chest pain or shortness of breath.  Inpatient Medications    Scheduled Meds: . aspirin EC  81 mg Oral Daily  . Chlorhexidine Gluconate Cloth  6 each Topical Daily  . docusate sodium  200 mg Oral Daily  . enoxaparin (LOVENOX) injection  40 mg Subcutaneous QHS  . folic acid  1 mg Oral Daily  . [START ON 05/26/2017] furosemide  40 mg Oral Daily  . lisinopril  5 mg Oral Daily  . mouth rinse  15 mL Mouth Rinse BID  . metoprolol tartrate  12.5 mg Oral BID  . pantoprazole  40 mg Oral QAC breakfast  . potassium chloride  20 mEq Oral Daily  . sodium chloride flush  10-40 mL Intracatheter Q12H  . sodium chloride flush  3 mL Intravenous Q12H  . zolpidem  5 mg Oral QHS   Continuous Infusions: . sodium chloride     PRN Meds: sodium chloride, acetaminophen, ALPRAZolam, bisacodyl **OR** bisacodyl, ondansetron **OR** ondansetron (ZOFRAN) IV, oxyCODONE, sodium chloride flush, sodium chloride flush, traMADol   Vital Signs    Vitals:   05/25/17 0632 05/25/17 0700 05/25/17 0848 05/25/17 1439  BP: 126/69  117/73 116/70  Pulse: 81 78 84 (!) 50  Resp: (!) 28 (!) 24 (!) 21 (!) 25  Temp: 99.1 F (37.3 C)  98.6 F (37 C) 98.2 F (36.8 C)  TempSrc: Oral  Oral Oral  SpO2: 95% 92% 95% 94%  Weight: 217 lb (98.4 kg)     Height:        Intake/Output Summary (Last 24 hours) at 05/25/17 1508 Last data filed at 05/25/17 0947  Gross per 24 hour  Intake              120 ml  Output             1775 ml  Net            -1655 ml   Filed Weights   05/23/17 0600 05/24/17 0500 05/25/17 [id number]  Weight: 217 lb 9.5 oz (98.7 kg) 220 lb (99.8 kg) 217 lb (98.4 kg)    Telemetry    Sinus rhythm,- Personally Reviewed   Physical Exam  Alert, oriented male in NAD GEN: No acute distress.   Neck: No JVD Cardiac: RRR, no murmurs, rubs, or gallops.  Respiratory: Clear to  auscultation bilaterally. GI: Soft, nontender, non-distended  MS: No edema; No deformity. Neuro:  Nonfocal  Psych: Normal affect   Labs    Chemistry Recent Labs Lab 05/19/17 0539 05/20/17 0509  05/23/17 0258 05/23/17 1632 05/23/17 1640 05/24/17 0315 05/25/17 0555  NA 136 138  < > 134* 134*  --  130* 131*  K 4.1 3.6  < > 4.1 4.1  --  4.0 4.2  CL 105 105  < > 103 95*  --  96* 96*  CO2 24 25  < > 24  --   --  28 28  GLUCOSE 99 97  < > 126* 128*  --  107* 105*  BUN 6 7  < > 9 13  --  11 8  CREATININE 0.89 0.95  < > 0.85 1.10 1.10 0.94 0.94  CALCIUM 8.7* 8.9  < > 8.1*  --   --  8.3* 8.2*  PROT 6.2* 5.9*  --   --   --   --   --   --  ALBUMIN 3.7 3.6  --   --   --   --   --   --   AST 18 17  --   --   --   --   --   --   ALT 18 20  --   --   --   --   --   --   ALKPHOS 58 59  --   --   --   --   --   --   BILITOT 0.6 0.8  --   --   --   --   --   --   GFRNONAA >60 >60  < > >60  --  >60 >60 >60  GFRAA >60 >60  < > >60  --  >60 >60 >60  ANIONGAP 7 8  < > 7  --   --  6 7  < > = values in this interval not displayed.   Hematology Recent Labs Lab 05/23/17 1640 05/24/17 0315 05/25/17 0555  WBC 16.7* 13.9* 11.1*  RBC 3.96* 3.53* 3.26*  HGB 13.6 12.0* 11.1*  HCT 40.2 35.5* 32.7*  MCV 101.5* 100.6* 100.3*  MCH 34.3* 34.0 34.0  MCHC 33.8 33.8 33.9  RDW 12.7 12.8 12.5  PLT 128* 112* 114*    Cardiac EnzymesNo results for input(s): TROPONINI in the last 168 hours. No results for input(s): TROPIPOC in the last 168 hours.   BNPNo results for input(s): BNP, PROBNP in the last 168 hours.   DDimer No results for input(s): DDIMER in the last 168 hours.   Radiology    Dg Chest 2 View  Result Date: 05/25/2017 CLINICAL DATA:  Shortness of breath. EXAM: CHEST  2 VIEW COMPARISON:  Radiograph of May 24, 2017. FINDINGS: Stable cardiomegaly. Left-sided chest tube has been removed without evidence of pneumothorax. Mild bibasilar subsegmental atelectasis is noted. Minimal bilateral  pleural effusions are noted. Bony thorax is unremarkable. IMPRESSION: Mild bibasilar subsegmental atelectasis with minimal bilateral pleural effusions. Left-sided chest tube has been removed without evidence of pneumothorax. Electronically Signed   By: Noah Raider, M.D.   On: 05/25/2017 07:27   Dg Chest Port 1 View  Result Date: 05/24/2017 CLINICAL DATA:  Postop check heart sx EXAM: PORTABLE CHEST 1 VIEW COMPARISON:  05/23/2017 FINDINGS: Swan-Ganz catheter has been removed. Right IJ sheath remains in place, tip overlying the level superior vena cava. Mediastinal drain has been removed. A left-sided chest tube remains in place. There is no evidence for pneumothorax. Minimal bibasilar atelectasis is present. Suspect trace left pleural effusion. There is mild gaseous distension of the stomach. IMPRESSION: 1. Interval removal of Swan-Ganz catheter and mediastinal drain. 2. Persistent left-sided chest tube without evidence for pneumothorax. Electronically Signed   By: Norva Pavlov M.D.   On: 05/24/2017 07:20     Patient Profile     66 y.o. male with USAP s/p CABG [phone]  Assessment & Plan    1.  CAD with unstable angina, now status post CABG 2.  Expected postoperative blood loss anemia, mild 3.  Hyperlipidemia, statin intolerant  The patient is feeling much better now that his statin is held.  States this gives him severe side effects.  As an outpatient he should be considered for a PCSK9 inhibitor.  We discussed his follow-up and he prefers to follow-up in [place].  His previous cardiologist is no longer in practice.  We will establish him with our group there.  He knows Dr. Tomie China and would  like to FU with him.   For questions or updates, please contact [facility] Please consult [email or web address] for contact info under Cardiology/STEMI.      Enzo Bi, MD  05/25/2017, 3:08 PM

## 2017-05-25 NOTE — Progress Notes (Signed)
Patient states that he thinks that the blood pressure medicine is making him tired and causing his head ache. Patient did not receive metoprolol tonight because BP parameters were not met. Patient said he will discuss this with doctor tomorrow.

## 2017-05-25 NOTE — Progress Notes (Signed)
  Patient Saturations on Room Air at Rest = 88%  Patient Saturations on [employer] while Ambulating = 84-85%  Patient Saturations on 4 Liters of oxygen while Ambulating = 88%

## 2017-05-25 NOTE — Discharge Summary (Signed)
Physician Discharge Summary  Patient ID: Noah Walker C Ostrum MRN: [id number] DOB/AGE: 65-Dec-1952 65 y.o.  Admit date: 05/17/2017 Discharge date: 05/27/2017  Admission Diagnoses:  Patient Active Problem List   Diagnosis Date Noted  . Bradycardia   . Chest pain due to CAD 05/18/2017  . Unstable angina (HCC)   . Chest pain with high risk for cardiac etiology 05/17/2017  . CAD S/P percutaneous coronary angioplasty 05/19/2015  . Essential hypertension 02/09/2015  . Hyperlipidemia with target LDL less than 70 - Reluctant to take medications 02/09/2015  . Smoking 02/09/2015  . Alcohol use 02/09/2015  . 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 S/P CABG x 2 05/22/2017  . Bradycardia   . Chest pain due to CAD 05/18/2017  . Unstable angina (HCC)   . Chest pain with high risk for cardiac etiology 05/17/2017  . CAD S/P percutaneous coronary angioplasty 05/19/2015  . Essential hypertension 02/09/2015  . Hyperlipidemia with target LDL less than 70 - Reluctant to take medications 02/09/2015  . Smoking 02/09/2015  . Alcohol use 02/09/2015  . 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 Noah Walker is a 66 yo white male with known history of nicotine abuse and CAD.  He is S/P PCI of the RCA.  He presented to the ED with complaints of chest pain consistent with unstable angina.  This developed while ambulating and was described as chest pressure that was retrosternal with radiation to the left and was associated diaphoresis.  The paint lasted for 10 minutes and was completely resolved with SL NTG.  He was pain free in the ED.  EKG was obtained  And showed non-specific changes.  Troponin was negative.  He was admitted for unstable angina and placed on Heparin.   Hospital Course:   Cardiology consult was obtained, and due to  patient's history it was felt cardiac catheterization would be indicated.  The risks and benefits of the procedure were explained to the patient and he was agreeable to proceed.  He underwent cardiac catheterization on 05/18/2017.  He was found to have significant 2V CAD with a mild reduction of EF of 45%.  It was felt coronary bypass grafting would be indicated and TCTS consult was obtained.  He was evaluated by Dr. Donata ClayVan Trigt who was in agreement the patient would benefit from bypass surgery.  The risks and benefits of the procedure were explained to the patient and he was agreeable to proceed.  He was taken to the operating room and underwent CABG x 2 utilizing LIMA to LAD and SVG to RCA.  He also underwent endoscopic harvest of greater saphenous vein from right thigh.  He tolerated the procedure without difficulty and was taken the SICU in stable condition.  He was extubated the evening of surgery.  During his stay in the SICU he was weaned off Neo-synephrine as tolerated. His chest tubes and arterial lines were removed without difficulty.  He was ambulating independently without difficulty. He was maintaining NSR and transferred to the telemetry unit on 05/24/2017.  Patient felt poorly the day after transfer.  He felt this was due to all the medications he was receiving.  He states statins make him have flu like symptoms.  His crestor was discontinued per his request.  He also states the BB and BP medications  make him feel weak and fatigued and he doesn't want to take them either.  The benefit of the medications were explained to the patient and he understands, but they make him feel lousy.  His BB and ACE were also discontinued.  He is bradycardic at baseline, so ultimately he may be someone who is unable to tolerate BB due to this.  We can readdress at post operative appointment or his Cardiologist can revisit in the future.  He is maintaining sinus rhythm currently.  His pacing wires were removed without  difficulty.  He is ambulating independently.  He is felt medically stable for discharge home today.        Significant Diagnostic Studies: angiography:    Prox RCA to Mid RCA lesion, 0 %stenosed.  Mid RCA lesion, 20 %stenosed.  Dist LAD lesion, 90 %stenosed.  Prox RCA lesion, 70 %stenosed.  Prox LAD to Mid LAD lesion, 95 %stenosed.  Prox Cx to Mid Cx lesion, 30 %stenosed.   Findings:  1. Significant 2v CAD with ulcerated 95% ostial LAD lesion and hazy 60-70% mid RCA lesion prior to stent 2. EF 45% with apical and inferoapical HK  Treatments: surgery:   1. Coronary artery bypass grafting x2 (left internal mammary artery to LAD, saphenous vein graft to right coronary artery).  2. Endoscopic harvest of right leg greater saphenous vein.  Disposition: 01-Home or Self Care   Discharge Medications:  The patient has been discharged on:   1.Beta Blocker:  Yes [   ]                              No   [ n  ]                              If No, reason: Bradycardia  2.Ace Inhibitor/ARB: Yes [  ]                                     No  [ x   ]                                     If No, reason: Labile BP  3.Statin:   Yes [   ]                  No  [ x  ]                  If No, reason:Intolerant  4.Marlowe Kays:  Yes  [ x  ]                  No   [   ]                  If No, reason:    Allergies as of 05/27/2017      Reactions   Statins    Flu Like symptoms      Medication List    STOP taking these medications   amLODipine 5 MG tablet Commonly known as:  NORVASC   metoprolol tartrate 25 MG tablet Commonly known as:  LOPRESSOR   nitroGLYCERIN 0.4 MG SL tablet Commonly known as:  NITROSTAT   potassium chloride  SA 20 MEQ tablet Commonly known as:  K-DUR,KLOR-CON   thiamine 100 MG tablet   ticagrelor 90 MG Tabs tablet Commonly known as:  BRILINTA     TAKE these medications   aspirin 81 MG tablet Take 81 mg by mouth daily.   Fish Oil 1000 MG Caps Take 1,000  mg by mouth daily.   folic acid 1 MG tablet Commonly known as:  FOLVITE Take 1 tablet (1 mg total) by mouth daily.   multivitamin with minerals Tabs tablet Take 1 tablet by mouth daily.   oxyCODONE 5 MG immediate release tablet Commonly known as:  Oxy IR/ROXICODONE Take 1-2 tablets (5-10 mg total) by mouth every 6 (six) hours as needed for severe pain.   pravastatin 40 MG tablet Commonly known as:  PRAVACHOL Take 1 tablet (40 mg total) by mouth daily.   vitamin C 250 MG tablet Commonly known as:  ASCORBIC ACID Take 250 mg by mouth daily.      Follow-up Information    Kerin Perna, MD Follow up on 06/24/2017.   Specialty:  Cardiothoracic Surgery Why:  Appointment is at 3:00, please get CXR at 2:30 at [facility] located on first floor of our office building Contact information: [address] [place] [address] [phone]        Revankar, Aundra Dubin, MD Follow up on 06/09/2017.   Specialty:  Cardiology Why:  at 2:40pm for your follow up appt.  Contact information: [address] [place]  [place] [address] [phone]           Signed: Rowe Clack 05/27/2017, 7:55 AM

## 2017-05-25 NOTE — Progress Notes (Signed)
Patient ambulated in the hall once on this shift, he said he would walk later tonight, patient educated on the needed to ambulate and continue to use his incentive spirometer, he verbalized understanding, will continue to monitor.

## 2017-05-25 NOTE — Progress Notes (Signed)
Patient resting quietly in bed with eyes closed,breathing even and unlabored,patient asked earlier that we should let him sleep saying he did not get enough sleep last night, bed in lowest position, call bell within reach will continue to monitor.

## 2017-05-25 NOTE — Op Note (Signed)
NAME:  Noah Walker, Noah Walker NO.:  MEDICAL RECORD NO.:  [id number]  LOCATION:                                 FACILITY:  PHYSICIAN:  Kerin Perna, M.D.       DATE OF BIRTH:  DATE OF PROCEDURE:  05/22/2017 DATE OF DISCHARGE:                              OPERATIVE REPORT   OPERATIONS: 1. Coronary artery bypass grafting x2 (left internal mammary artery to     LAD, saphenous vein graft to right coronary artery). 2. Endoscopic harvest of right leg greater saphenous vein.  SURGEON:  Kerin Perna, MD.  ASSISTANT:  Gershon Crane, PA-C.  ANESTHESIA:  General.  PREOPERATIVE DIAGNOSES: 1. Non-ST elevation myocardial infarction. 2. Multivessel coronary artery disease. 3. Previous percutaneous coronary intervention of the right coronary     artery.  POSTOPERATIVE DIAGNOSES: 1. Non-ST elevation myocardial infarction. 2. Multivessel coronary artery disease. 3. Previous percutaneous coronary intervention of the right coronary     artery.  DESCRIPTION OF PROCEDURE:  After the patient had been thoroughly evaluated and the procedure of CABG discussed in detail with the patient and family including indications, benefits, alternatives, and risks, he was again examined in the preop holding area and then after informed consent was confirmed, transported back to the operating room.  He was induced for general anesthesia under invasive hemodynamic monitoring.  A transesophageal echo probe was placed by the Anesthesia team.  The patient was prepped and draped as a sterile field.  A proper time-out was performed.  A sternal incision was made as the saphenous vein was harvested endoscopically from the right leg.  The left internal mammary artery was harvested as a pedicle graft from its origin at the subclavian vessels.  It was a good vessel with excellent flow.  The sternal retractor was placed and the pericardium was opened and suspended.  Pursestrings were placed in the  ascending aorta and right atrium and after heparin was administered, the patient was cannulated and placed on cardiopulmonary bypass.  The coronaries were identified for grafting, and the mammary artery and vein grafts were prepared for the distal anastomoses.  Cardioplegia was delivered through 2 cannulas placed in the ascending aorta and right atrium into the coronary sinus. The patient was cooled to 32 degrees and aortic crossclamp was applied. One liter of cold blood cardioplegia was delivered in split doses between the antegrade aortic and retrograde coronary sinus catheters. There was good cardioplegic arrest and septal temperature dropped less than 14 degrees.  Cardioplegia was delivered every 20 minutes.  The distal coronary anastomoses were performed.  The first distal anastomosis was to the distal RCA beyond the previous stents.  There was a proximal 80% stenosis.  A reversed saphenous vein was sewn end to side to this 1.5-mm vessel with good flow.  Cardioplegia was redosed.  The second distal anastomosis was to the distal third of the LAD.  There was a proximal 95% stenosis.  The left IMA pedicle was brought through an opening and the left lateral pericardium was brought down onto the LAD and sewn end-to-side with a running 8-0 Prolene.  There was good flow through  the anastomosis after briefly releasing the pedicle bulldog on the mammary artery.  The bulldog was reapplied and the pedicle was secured to the epicardium with 6-0 Prolenes.  Cardioplegia was redosed.  While crossclamp was still in place, a single vein anastomosis to the ascending aorta was accomplished with a 4.5-mm punch running 6-0 Prolene.  Prior to removing the crossclamp, air was vented from the coronaries with a dose of retrograde warm blood cardioplegia.  The crossclamp was removed.  The heart resumed a spontaneous rhythm.  The vein graft was de-aired and opened.  The grafts were checked and found to be  hemostatic at the proximal and distal anastomoses and there was good flow through each graft.  The patient was rewarmed and reperfused.  Temporary pacing wires were applied.  The lungs were expanded and the ventilator was resumed.  The patient was weaned from cardiopulmonary bypass without difficulty.  Echo showed preserved normal LV function.  Protamine was administered without adverse reaction.  The cannulas were removed.  The mediastinum was irrigated with warm saline.  The superior pericardial fat was closed over the aorta.  Anterior mediastinal and left pleural chest tubes were placed and brought out through separate incisions.  The sternum was closed with wire.  The pectoralis fascia was closed with running #1 Vicryl.  The subcutaneous and skin layers were closed in running Vicryl and sterile dressings were applied.  Total cardiopulmonary bypass time was 70 minutes time.     Kerin PernaPeter Van Trigt, M.D.     PV/MEDQ  D:  05/24/2017  T:  05/24/2017  Job:  [id number]

## 2017-05-26 MED FILL — Electrolyte-R (PH 7.4) Solution: INTRAVENOUS | Qty: 3000 | Status: AC

## 2017-05-26 MED FILL — Sodium Chloride IV Soln 0.9%: INTRAVENOUS | Qty: 2000 | Status: AC

## 2017-05-26 MED FILL — Lidocaine HCl IV Inj 20 MG/ML: INTRAVENOUS | Qty: 5 | Status: AC

## 2017-05-26 MED FILL — Mannitol IV Soln 20%: INTRAVENOUS | Qty: 500 | Status: AC

## 2017-05-26 MED FILL — Sodium Bicarbonate IV Soln 8.4%: INTRAVENOUS | Qty: 50 | Status: AC

## 2017-05-26 NOTE — Care Management Important Message (Signed)
Important Message  Patient Details  Name: Noah Walker MRN: [id number] Date of Birth: [birth year]   Medicare Important Message Given:  Yes    Kyla Balzarine 05/26/2017, 9:28 AM

## 2017-05-26 NOTE — Progress Notes (Signed)
Pt insisting to take shower. Became irritable when told he couldn't and needed doctors order to shower. Pt took telemetry off and showered anyway. MD paged. VSS. Pt back on telemetry and made comfortable.   Versie Starks, RN

## 2017-05-26 NOTE — Progress Notes (Signed)
EPW removed per order. Tips intact. VSS. Pt made comfortable and educated on 1 hour of bed rest. Call bell in reach. Will continue to monitor.  Versie Starks, RN

## 2017-05-26 NOTE — Progress Notes (Signed)
.  SATURATION QUALIFICATIONS: (This note is used to comply with regulatory documentation for home oxygen)  Patient Saturations on Room Air at Rest = 85%  Patient Saturations on Room Air while Ambulating = NA  Patient Saturations on NA Liters of oxygen while Ambulating = NA%  Please briefly explain why patient needs home oxygen:  Pt oxygen saturation was 85% while asleep. Placed on 2L [place] and oxygen saturation improved to 95%

## 2017-05-26 NOTE — Progress Notes (Signed)
CARDIAC REHAB PHASE I   PRE:  Rate/Rhythm: 75 SR    BP: sitting 126/72    SaO2: 93 2L  MODE:  Ambulation: 790 ft   POST:  Rate/Rhythm: 89 SR    BP: sitting 126/89     SaO2: 90 RA  Pt just off bedrest. Walked without O2, SaO2 maintained 89-90 RA entire walk. Tolerated well, long distance, no c/o. Pt used IS on return to room. Encouraged more use. Kept pt off O2 and communicated with RN need for documentation while sleeping (he needed O2 asleep). Will f/u [phone]   Harriet Masson CES, ACSM 05/26/2017 11:34 AM

## 2017-05-26 NOTE — Progress Notes (Signed)
Patient irritable complaining about staff coming in and out of room to check on him.Patient yelled,"I can't get any fucking sleep in this hospital.I"m not that sick that people need to keep coming in and out of my room.Leave me alone and let me get some rest."Attempted to explain to patient that he just had major surgery and staff just concerned for his well being.Patient continues to ask to be left alone.Patient oxygen saturation down 86-88% on room air refusing to use incentive spirometer also refusing continuous pulse ox monitoring.Patient does agree to wear oxygen at 2 liters nasal canula tonight.Patient also refusing to have bed side rails up x 2 for safety.Patient said he would not fall out of bed.Patient significant other at bedside.

## 2017-05-26 NOTE — Progress Notes (Addendum)
      [address]       [place] [address]             [phone]      4 Days Post-Op Procedure(s) (LRB): CORONARY ARTERY BYPASS GRAFTING (CABG) X 2 WITH ENDOSCOPIC HARVESTING OF RIGHT SAPHENOUS VEIN.LIMA-LAD SVG-RCA (N/A) TRANSESOPHAGEAL ECHOCARDIOGRAM (TEE) (N/A)   Subjective:  Patient feeling better this morning.  States he is waiting on breakfast and then will get up and walk.  He states that can not be on the medications for his HR and BP as they make him feel very poorly and like he has energy.  Counseled patient on importance of taking this medications, but will stop per his request as he states he will not take them.  Objective: Vital signs in last 24 hours: Temp:  [98 F (36.7 C)-98.6 F (37 C)] 98.4 F (36.9 C) (10/30 0455) Pulse Rate:  [50-84] 84 (10/29 2000) Cardiac Rhythm: Normal sinus rhythm (10/30 0700) Resp:  [21-31] 31 (10/29 2000) BP: (109-117)/(64-73) 109/64 (10/30 0455) SpO2:  [94 %-96 %] 96 % (10/30 0455) Weight:  [209 lb 7 oz (95 kg)] 209 lb 7 oz (95 kg) (10/30 0458)  Intake/Output from previous day: 10/29 0701 - 10/30 0700 In: 3 [I.V.:3] Out: 520 [Urine:520]  General appearance: alert, cooperative and no distress Heart: regular rate and rhythm Lungs: clear to auscultation bilaterally Abdomen: soft, non-tender; bowel sounds normal; no masses,  no organomegaly Extremities: edema trace Wound: clean and dry  Lab Results:  Recent Labs  05/24/17 0315 05/25/17 0555  WBC 13.9* 11.1*  HGB 12.0* 11.1*  HCT 35.5* 32.7*  PLT 112* 114*   BMET:  Recent Labs  05/24/17 0315 05/25/17 0555  NA 130* 131*  K 4.0 4.2  CL 96* 96*  CO2 28 28  GLUCOSE 107* 105*  BUN 11 8  CREATININE 0.94 0.94  CALCIUM 8.3* 8.2*    PT/INR: No results for input(s): LABPROT, INR in the last 72 hours. ABG    Component Value Date/Time   PHART 7.407 05/23/2017 0153   HCO3 27.3 05/23/2017 0153   TCO2 30 05/23/2017 1632   ACIDBASEDEF 1.0 05/22/2017  1932   O2SAT 98.0 05/23/2017 0153   CBG (last 3)   Recent Labs  05/24/17 1156 05/24/17 2217 05/25/17 0624  GLUCAP 128* 114* 119*    Assessment/Plan: S/P Procedure(s) (LRB): CORONARY ARTERY BYPASS GRAFTING (CABG) X 2 WITH ENDOSCOPIC HARVESTING OF RIGHT SAPHENOUS VEIN.LIMA-LAD SVG-RCA (N/A) TRANSESOPHAGEAL ECHOCARDIOGRAM (TEE) (N/A)  1. CV- Sinus brady this morning, BP has been labile- patient is refusing BB and ACE-- will d/c 2. Pulm- no acute issues, continue IS 3. Renal- creatinine has been stable, continue Lasix for a few days 4. Neuro- Insomnia- not much relief with Ambien, states difficulty sleeping as he cant lay on his side 5. Dispo- patient feeling better today, however he is not taking his BB or BP medications as he states they make him feel like his has decreased energy and fatigued, expressed importance of patient staying on these medications, but he does not want to take them... Will d/c for now.... Hopefully as he recovers from surgery can restart BB at low dose at Cardiology follow up   LOS: 8 days    BARRETT, ERIN 05/26/2017    continue to monitor recovery possibly home 10-31 patient examined and medical record reviewed,agree with above note. Kathlee Nations Trigt III 05/26/2017

## 2017-05-27 MED ORDER — OXYCODONE HCL 5 MG PO TABS: 5.0000 mg | ORAL_TABLET | Freq: Four times a day (QID) | ORAL | 0 refills | Status: DC | PRN

## 2017-05-27 NOTE — Progress Notes (Signed)
      [address]       [place] [address]             [phone]      5 Days Post-Op Procedure(s) (LRB): CORONARY ARTERY BYPASS GRAFTING (CABG) X 2 WITH ENDOSCOPIC HARVESTING OF RIGHT SAPHENOUS VEIN.LIMA-LAD SVG-RCA (N/A) TRANSESOPHAGEAL ECHOCARDIOGRAM (TEE) (N/A) Subjective: Feels very well  Objective: Vital signs in last 24 hours: Temp:  [98.2 F (36.8 C)-98.6 F (37 C)] 98.2 F (36.8 C) (10/31 0600) Pulse Rate:  [71-82] 74 (10/31 0600) Cardiac Rhythm: Normal sinus rhythm (10/30 1948) Resp:  [22-26] 24 (10/31 0600) BP: (119-131)/(72-90) 130/90 (10/31 0600) SpO2:  [88 %-98 %] 98 % (10/31 0600)  Hemodynamic parameters for last 24 hours:    Intake/Output from previous day: 10/30 0701 - 10/31 0700 In: 720 [P.O.:720] Out: -  Intake/Output this shift: No intake/output data recorded.  General appearance: alert, cooperative and no distress Heart: regular rate and rhythm Lungs: clear to auscultation bilaterally Abdomen: benign Extremities: no edema Wound: incis healing well  Lab Results:  Recent Labs  05/25/17 0555  WBC 11.1*  HGB 11.1*  HCT 32.7*  PLT 114*   BMET:  Recent Labs  05/25/17 0555  NA 131*  K 4.2  CL 96*  CO2 28  GLUCOSE 105*  BUN 8  CREATININE 0.94  CALCIUM 8.2*    PT/INR: No results for input(s): LABPROT, INR in the last 72 hours. ABG    Component Value Date/Time   PHART 7.407 05/23/2017 0153   HCO3 27.3 05/23/2017 0153   TCO2 30 05/23/2017 1632   ACIDBASEDEF 1.0 05/22/2017 1932   O2SAT 98.0 05/23/2017 0153   CBG (last 3)   Recent Labs  05/24/17 1156 05/24/17 2217 05/25/17 0624  GLUCAP 128* 114* 119*    Meds Scheduled Meds: . aspirin EC  81 mg Oral Daily  . docusate sodium  200 mg Oral Daily  . enoxaparin (LOVENOX) injection  40 mg Subcutaneous QHS  . folic acid  1 mg Oral Daily  . furosemide  40 mg Oral Daily  . mouth rinse  15 mL Mouth Rinse BID  . pantoprazole  40 mg Oral QAC breakfast  .  potassium chloride  20 mEq Oral Daily  . sodium chloride flush  10-40 mL Intracatheter Q12H  . sodium chloride flush  3 mL Intravenous Q12H  . zolpidem  5 mg Oral QHS   Continuous Infusions: . sodium chloride     PRN Meds:.sodium chloride, acetaminophen, ALPRAZolam, bisacodyl **OR** bisacodyl, ondansetron **OR** ondansetron (ZOFRAN) IV, oxyCODONE, sodium chloride flush, sodium chloride flush, traMADol  Xrays No results found.  Assessment/Plan: S/P Procedure(s) (LRB): CORONARY ARTERY BYPASS GRAFTING (CABG) X 2 WITH ENDOSCOPIC HARVESTING OF RIGHT SAPHENOUS VEIN.LIMA-LAD SVG-RCA (N/A) TRANSESOPHAGEAL ECHOCARDIOGRAM (TEE) (N/A) Plan for discharge: see discharge orders Discussed smoking cessation importance long term HgbA1C 4.9- encouraged heart healthy diet    LOS: 9 days    Seren Chaloux E 05/27/2017

## 2017-05-27 NOTE — Progress Notes (Signed)
Patient refused to stand for weight and refused morning walk.

## 2017-05-27 NOTE — Progress Notes (Signed)
Gave pt discharge paperwork, including prescription for oxycodone. Chest tube sutures removed, steri strips applied. Discussed with the patient and all questioned fully answered.   Versie Starks, RN

## 2017-05-27 NOTE — Progress Notes (Signed)
Patient refused vital signs does not want to be awaken.

## 2017-05-27 NOTE — Progress Notes (Signed)
Patient refused 0400 vital signs this morning.

## 2017-05-27 NOTE — Progress Notes (Addendum)
Pt walked without difficulty. No c/o. Ed completed with pt and girlfriend. Discussed sternal precautions, IS, mobility, ex, smoking cessation, diet, and CRPII. Will send referral to [facility] however I cannot put in order due to cardiologist being from [place]. Encouraged pt to have a balance of rest and activity (pt is eager to go home and sleep for numerous hours). He has had documentation of low SaO2s when he sleeps however pt declines home oxygen. Encouraged him to call TCTS if he changes his mind. Pt sts he will "try his best" to quit smoking. I encouraged him to get nicotine patches and use fake cigarette, which he enjoys. Pt eager to d/c. [id number] Ethelda Chick CES, ACSM 9:04 AM 05/27/2017

## 2017-05-30 ENCOUNTER — Encounter (HOSPITAL_COMMUNITY): Payer: Self-pay | Admitting: Cardiothoracic Surgery

## 2017-05-30 NOTE — Addendum Note (Signed)
Addendum  created 05/30/17 0811 by Laramie Meissner, MD   Anesthesia Event edited, Anesthesia Staff edited    

## 2017-06-08 ENCOUNTER — Ambulatory Visit: Payer: Medicare Other | Admitting: Physician Assistant

## 2017-06-09 ENCOUNTER — Other Ambulatory Visit: Payer: Self-pay | Admitting: Cardiology

## 2017-06-09 ENCOUNTER — Ambulatory Visit (INDEPENDENT_AMBULATORY_CARE_PROVIDER_SITE_OTHER): Payer: Medicare Other | Admitting: Cardiology

## 2017-06-09 ENCOUNTER — Encounter: Payer: Self-pay | Admitting: Cardiology

## 2017-06-09 VITALS — BP 116/76 | HR 92 | Ht 71.0 in | Wt 213.0 lb

## 2017-06-09 DIAGNOSIS — I2510 Atherosclerotic heart disease of native coronary artery without angina pectoris: Secondary | ICD-10-CM

## 2017-06-09 DIAGNOSIS — F17210 Nicotine dependence, cigarettes, uncomplicated: Secondary | ICD-10-CM

## 2017-06-09 DIAGNOSIS — E785 Hyperlipidemia, unspecified: Secondary | ICD-10-CM

## 2017-06-09 DIAGNOSIS — I200 Unstable angina: Secondary | ICD-10-CM

## 2017-06-09 DIAGNOSIS — Z951 Presence of aortocoronary bypass graft: Secondary | ICD-10-CM

## 2017-06-09 DIAGNOSIS — Z9861 Coronary angioplasty status: Secondary | ICD-10-CM

## 2017-06-09 DIAGNOSIS — Z789 Other specified health status: Secondary | ICD-10-CM | POA: Diagnosis not present

## 2017-06-09 DIAGNOSIS — I10 Essential (primary) hypertension: Secondary | ICD-10-CM

## 2017-06-09 DIAGNOSIS — F17200 Nicotine dependence, unspecified, uncomplicated: Secondary | ICD-10-CM

## 2017-06-09 DIAGNOSIS — Z7289 Other problems related to lifestyle: Secondary | ICD-10-CM

## 2017-06-09 MED ORDER — ALPRAZOLAM 0.25 MG PO TABS: 0.2500 mg | ORAL_TABLET | Freq: Every evening | ORAL | 0 refills | Status: DC | PRN

## 2017-06-09 MED ORDER — NICOTINE 14 MG/24HR TD PT24: 14.0000 mg | MEDICATED_PATCH | Freq: Every day | TRANSDERMAL | 0 refills | Status: DC

## 2017-06-09 NOTE — Patient Instructions (Signed)
Medication Instructions:  Your physician has recommended you make the following change in your medication:  START Xanax 0.25 mg as needed START Nicotine patch OTC  Labwork: Your physician recommends that you have the following labs drawn: CBC, BMP, TSH. And liver/lipid    Testing/Procedures: None  Follow-Up: Your physician recommends that you schedule a follow-up appointment in: 1 month   Any Other Special Instructions Will Be Listed Below (If Applicable).     If you need a refill on your cardiac medications before your next appointment, please call your pharmacy.   [facility]  Garey Ham, RN, BSN   Alprazolam tablets What is this medicine? ALPRAZOLAM (al PRAY zoe lam) is a benzodiazepine. It is used to treat anxiety and panic attacks. This medicine may be used for other purposes; ask your health care provider or pharmacist if you have questions. COMMON BRAND NAME(S): Xanax What should I tell my health care provider before I take this medicine? They need to know if you have any of these conditions: -an alcohol or drug abuse problem -bipolar disorder, depression, psychosis or other mental health conditions -glaucoma -kidney or liver disease -lung or breathing disease -myasthenia gravis -Parkinson's disease -porphyria -seizures or a history of seizures -suicidal thoughts -an unusual or allergic reaction to alprazolam, other benzodiazepines, foods, dyes, or preservatives -pregnant or trying to get pregnant -breast-feeding How should I use this medicine? Take this medicine by mouth with a glass of water. Follow the directions on the prescription label. Take your medicine at regular intervals. Do not take it more often than directed. Do not stop taking except on your doctor's advice. A special MedGuide will be given to you by the pharmacist with each prescription and refill. Be sure to read this information carefully each time. Talk to your pediatrician regarding  the use of this medicine in children. Special care may be needed. Overdosage: If you think you have taken too much of this medicine contact a poison control center or emergency room at once. NOTE: This medicine is only for you. Do not share this medicine with others. What if I miss a dose? If you miss a dose, take it as soon as you can. If it is almost time for your next dose, take only that dose. Do not take double or extra doses. What may interact with this medicine? Do not take this medicine with any of the following medications: -certain antiviral medicines for HIV or AIDS like delavirdine, indinavir -certain medicines for fungal infections like ketoconazole and itraconazole -narcotic medicines for cough -sodium oxybate This medicine may also interact with the following medications: -alcohol -antihistamines for allergy, cough and cold -certain antibiotics like clarithromycin, erythromycin, isoniazid, rifampin, rifapentine, rifabutin, and troleandomycin -certain medicines for blood pressure, heart disease, irregular heart beat -certain medicines for depression, like amitriptyline, fluoxetine, sertraline -certain medicines for seizures like carbamazepine, oxcarbazepine, phenobarbital, phenytoin, primidone -cimetidine -cyclosporine -male hormones, like estrogens or progestins and birth control pills, patches, rings, or injections -general anesthetics like halothane, isoflurane, methoxyflurane, propofol -grapefruit juice -local anesthetics like lidocaine, pramoxine, tetracaine -medicines that relax muscles for surgery -narcotic medicines for pain -other antiviral medicines for HIV or AIDS -phenothiazines like chlorpromazine, mesoridazine, prochlorperazine, thioridazine This list may not describe all possible interactions. Give your health care provider a list of all the medicines, herbs, non-prescription drugs, or dietary supplements you use. Also tell them if you smoke, drink alcohol,  or use illegal drugs. Some items may interact with your medicine. What should I watch for  while using this medicine? Tell your doctor or health care professional if your symptoms do not start to get better or if they get worse. Do not stop taking except on your doctor's advice. You may develop a severe reaction. Your doctor will tell you how much medicine to take. You may get drowsy or dizzy. Do not drive, use machinery, or do anything that needs mental alertness until you know how this medicine affects you. To reduce the risk of dizzy and fainting spells, do not stand or sit up quickly, especially if you are an older patient. Alcohol may increase dizziness and drowsiness. Avoid alcoholic drinks. If you are taking another medicine that also causes drowsiness, you may have more side effects. Give your health care provider a list of all medicines you use. Your doctor will tell you how much medicine to take. Do not take more medicine than directed. Call emergency for help if you have problems breathing or unusual sleepiness. What side effects may I notice from receiving this medicine? Side effects that you should report to your doctor or health care professional as soon as possible: -allergic reactions like skin rash, itching or hives, swelling of the face, lips, or tongue -breathing problems -confusion -loss of balance or coordination -signs and symptoms of low blood pressure like dizziness; feeling faint or lightheaded, falls; unusually weak or tired -suicidal thoughts or other mood changes Side effects that usually do not require medical attention (report to your doctor or health care professional if they continue or are bothersome): -dizziness -dry mouth -nausea, vomiting -tiredness This list may not describe all possible side effects. Call your doctor for medical advice about side effects. You may report side effects to FDA at 1-800-FDA-1088. Where should I keep my medicine? Keep out of the  reach of children. This medicine can be abused. Keep your medicine in a safe place to protect it from theft. Do not share this medicine with anyone. Selling or giving away this medicine is dangerous and against the law. Store at room temperature between 20 and 25 degrees C (68 and 77 degrees F). This medicine may cause accidental overdose and death if taken by other adults, children, or pets. Mix any unused medicine with a substance like cat litter or coffee grounds. Then throw the medicine away in a sealed container like a sealed bag or a coffee can with a lid. Do not use the medicine after the expiration date. NOTE: This sheet is a summary. It may not cover all possible information. If you have questions about this medicine, talk to your doctor, pharmacist, or health care provider.  2018 Elsevier/Gold Standard (2015-04-12 13:47:25)

## 2017-06-09 NOTE — Progress Notes (Signed)
Bmp

## 2017-06-09 NOTE — Progress Notes (Signed)
Cardiology Office Note:    Date:  06/09/2017   ID:  Noah Walker, DOB 1951-05-02, MRN [id number]  PCP:  Titus DubinFutrell, Thomas M., MD  Cardiologist:  Garwin Brothersajan R Giavonni Fonder, MD   Referring MD: Titus DubinFutrell, Thomas M., MD    ASSESSMENT:    1. Essential hypertension   2. Hyperlipidemia with target LDL less than 70 - Reluctant to take medications   3. S/P CABG x 2   4. Smoking   5. Alcohol use   6. CAD S/P percutaneous coronary angioplasty    PLAN:    In order of problems listed above:  1. Secondary prevention stressed to the patient.  Importance of compliance with diet and medications stressed.  I advised him to stay away from alcohol completely. 2. His blood pressure is stable 3. He requests Xanax to help him sleep.  I have given him 15 tablets of 0.25 mg.  He will take half tablet as needed.  Precautions were explained he vocalized understanding 4. Back in follow-up appointment in a month or earlier if he has any concerns.  He will be fasting at that time and will have blood work including lipids 5. I spent 5 minutes with the patient discussing solely about smoking. Smoking cessation was counseled. I suggested to the patient also different medications and pharmacological interventions. Patient is keen to try stopping on its own at this time. He will get back to me if he needs any further assistance in this matter.   Medication Adjustments/Labs and Tests Ordered: Current medicines are reviewed at length with the patient today.  Concerns regarding medicines are outlined above.  No orders of the defined types were placed in this encounter.  No orders of the defined types were placed in this encounter.    History of Present Illness:    Noah Walker is a 66 y.o. male who is being seen today for the evaluation of coronary artery disease post CABG surgery at the request of Futrell, Lorin Pickethomas M., MD.  He has history of coronary artery disease post coronary intervention in the past.  He did not take  good care of himself.  He underwent surgery after it was discovered in hospital.  He has done well.  He denies any problems at this time.  He is recovering well from his surgery.  Past Medical History:  Diagnosis Date  . MI (myocardial infarction) (HCC)   . Reflux     Past Surgical History:  Procedure Laterality Date  . CARDIAC CATHETERIZATION    . CORONARY ANGIOPLASTY      Current Medications: Current Meds  Medication Sig  . aspirin 81 MG tablet Take 81 mg by mouth daily.  . folic acid (FOLVITE) 1 MG tablet Take 1 tablet (1 mg total) by mouth daily.  . Multiple Vitamin (MULTIVITAMIN WITH MINERALS) TABS tablet Take 1 tablet by mouth daily.  . Omega-3 Fatty Acids (FISH OIL) 1000 MG CAPS Take 1,000 mg by mouth daily.  Marland Kitchen. oxyCODONE (OXY IR/ROXICODONE) 5 MG immediate release tablet Take 1-2 tablets (5-10 mg total) by mouth every 6 (six) hours as needed for severe pain.  . pravastatin (PRAVACHOL) 40 MG tablet Take 1 tablet (40 mg total) by mouth daily.  . vitamin C (ASCORBIC ACID) 250 MG tablet Take 250 mg by mouth daily.     Allergies:   Statins   Social History   Socioeconomic History  . Marital status: Unknown    Spouse name: None  . Number of children: None  .  Years of education: None  . Highest education level: None  Social Needs  . Financial resource strain: None  . Food insecurity - worry: None  . Food insecurity - inability: None  . Transportation needs - medical: None  . Transportation needs - non-medical: None  Occupational History  . None  Tobacco Use  . Smoking status: Current Every Day Smoker    Packs/day: 0.50    Types: Cigarettes  . Smokeless tobacco: Never Used  Substance and Sexual Activity  . Alcohol use: Yes  . Drug use: No  . Sexual activity: None  Other Topics Concern  . None  Social History Narrative  . None     Family History: The patient's family history is negative for CAD and Stroke.  ROS:   Please see the history of present illness.     All other systems reviewed and are negative.  EKGs/Labs/Other Studies Reviewed:    The following studies were reviewed today: I discussed my findings with the patient extensively after reviewing his chart.   Recent Labs: 05/20/2017: ALT 20; TSH 3.664 05/23/2017: Magnesium 2.2 05/25/2017: BUN 8; Creatinine, Ser 0.94; Hemoglobin 11.1; Platelets 114; Potassium 4.2; Sodium 131  Recent Lipid Panel    Component Value Date/Time   CHOL 192 05/19/2017 0539   TRIG 168 (H) 05/19/2017 0539   HDL 41 05/19/2017 0539   CHOLHDL 4.7 05/19/2017 0539   VLDL 34 05/19/2017 0539   LDLCALC 117 (H) 05/19/2017 0539    Physical Exam:    VS:  BP 116/76   Pulse 92   Ht 5\' 11"  (1.803 m)   Wt 213 lb (96.6 kg)   SpO2 96%   BMI 29.71 kg/m     Wt Readings from Last 3 Encounters:  06/09/17 213 lb (96.6 kg)  05/26/17 209 lb 7 oz (95 kg)  02/07/15 220 lb (99.8 kg)     GEN: Patient is in no acute distress HEENT: Normal NECK: No JVD; No carotid bruits LYMPHATICS: No lymphadenopathy CARDIAC: S1 S2 regular, 2/6 systolic murmur at the apex. RESPIRATORY:  Clear to auscultation without rales, wheezing or rhonchi  ABDOMEN: Soft, non-tender, non-distended MUSCULOSKELETAL:  No edema; No deformity  SKIN: Warm and dry NEUROLOGIC:  Alert and oriented x 3 PSYCHIATRIC:  Normal affect    Signed, Garwin Brothers, MD  06/09/2017 3:16 PM    [facility]

## 2017-06-17 ENCOUNTER — Other Ambulatory Visit: Payer: Self-pay | Admitting: Cardiothoracic Surgery

## 2017-06-17 DIAGNOSIS — Z951 Presence of aortocoronary bypass graft: Secondary | ICD-10-CM

## 2017-06-22 ENCOUNTER — Other Ambulatory Visit: Payer: Self-pay

## 2017-06-22 ENCOUNTER — Ambulatory Visit (INDEPENDENT_AMBULATORY_CARE_PROVIDER_SITE_OTHER): Payer: Self-pay | Admitting: Physician Assistant

## 2017-06-22 ENCOUNTER — Ambulatory Visit
Admission: RE | Admit: 2017-06-22 | Discharge: 2017-06-22 | Disposition: A | Discharge status: Home / Self Care | Payer: Medicare Other | Source: Ambulatory Visit | Attending: Cardiothoracic Surgery | Admitting: Cardiothoracic Surgery

## 2017-06-22 VITALS — BP 118/85 | HR 84 | Ht 71.0 in | Wt 205.0 lb

## 2017-06-22 DIAGNOSIS — Z951 Presence of aortocoronary bypass graft: Secondary | ICD-10-CM

## 2017-06-22 NOTE — Progress Notes (Signed)
HPI: Patient returns for routine postoperative follow-up having undergone CABG x 2 on 05/22/2017.  The patient's early postoperative recovery while in the hospital was notable for flu like symptoms with statin and use of BB.  Patient refusing both medications since they make him feel so poor.  Since hospital discharge the patient reports he is doing well.  He states he is walking as much as he can outside and inside if weather is bad.  He remains numb across his chest.  His incisions are well healed.  He has been following with Dr. Henrietta Hoover as instructed.  He continues to smoke, but is trying to decrease as he can.  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    He does ask if he can take Viagra and resume sexual activity.    Current Outpatient Medications  Medication Sig Dispense Refill  . ALPRAZolam (XANAX) 0.25 MG tablet Take 1 tablet (0.25 mg total) at bedtime as needed by mouth for anxiety. 15 tablet 0  . aspirin 81 MG tablet Take 81 mg by mouth daily.    . folic acid (FOLVITE) 1 MG tablet Take 1 tablet (1 mg total) by mouth daily.    . Multiple Vitamin (MULTIVITAMIN WITH MINERALS) TABS tablet Take 1 tablet by mouth daily.    . nicotine (NICODERM CQ - DOSED IN MG/24 HOURS) 14 mg/24hr patch Place 1 patch (14 mg total) daily onto the skin. 28 patch 0  . Omega-3 Fatty Acids (FISH OIL) 1000 MG CAPS Take 1,000 mg by mouth daily.    Marland Kitchen oxyCODONE (OXY IR/ROXICODONE) 5 MG immediate release tablet Take 1-2 tablets (5-10 mg total) by mouth every 6 (six) hours as needed for severe pain. 30 tablet 0  . pravastatin (PRAVACHOL) 40 MG tablet Take 1 tablet (40 mg total) by mouth daily. 30 tablet 1  . vitamin C (ASCORBIC ACID) 250 MG tablet Take 250 mg by mouth daily.     No current facility-administered medications for this visit.     Physical Exam:  BP 118/85 (BP Location: Left Arm, Patient Position: Sitting, Cuff Size:  Normal)   Pulse 84   Ht 5\' 11"  (1.803 m)   Wt 205 lb (93 kg)   SpO2 98%   BMI 28.59 kg/m   Gen: no apparent distress Heart: RRR Lungs: CTA bilaterally Ext: no edema Incisions: clean and dry  Diagnostic Tests:  CXR: no acute issues, sternal wires intact, no pleural effusions  A/P:  1. S/P CABG x 2- doing well, BP and HR controlled- patient is intolerant of BB and statins in regards to flu like symptoms and feeling poorly overall 2. Activity- re-iterated sternal precautions and importance for minimum of 6 weeks.  Patient instructed he may resume use of Viagra as long as not taking any nitrates.  In regards to sexual activity he was instructed that he may resume as long as he feels up to it and does not put full weight on his upper extremities.  He has already resumed driving 3. Encouraged continued attempts with smoking cessation 4. Requesting increased Xanax prescription, instructed patient he would need to make an appointment with his PCP for this 5. RTC prn  Lowella Dandy, PA-C [facility] [phone]

## 2017-06-23 ENCOUNTER — Telehealth: Payer: Self-pay

## 2017-06-23 NOTE — Telephone Encounter (Signed)
Left voicemail for the patient regarding heart strides.

## 2017-06-23 NOTE — Telephone Encounter (Signed)
Informed the patient that [facility] had sent a request to [facility] for cardiac rehab. The patient stated that he is not working now and needs to think about if he can participate in cardiac rehab.

## 2017-06-24 ENCOUNTER — Ambulatory Visit: Payer: Medicare Other | Admitting: Cardiothoracic Surgery

## 2017-07-07 DIAGNOSIS — I252 Old myocardial infarction: Secondary | ICD-10-CM | POA: Insufficient documentation

## 2017-07-09 ENCOUNTER — Encounter: Payer: Self-pay | Admitting: Cardiology

## 2017-07-09 ENCOUNTER — Ambulatory Visit (INDEPENDENT_AMBULATORY_CARE_PROVIDER_SITE_OTHER): Payer: Medicare Other | Admitting: Cardiology

## 2017-07-09 VITALS — BP 118/60 | HR 72 | Ht 71.0 in | Wt 211.0 lb

## 2017-07-09 DIAGNOSIS — I10 Essential (primary) hypertension: Secondary | ICD-10-CM | POA: Diagnosis not present

## 2017-07-09 DIAGNOSIS — F17210 Nicotine dependence, cigarettes, uncomplicated: Secondary | ICD-10-CM | POA: Insufficient documentation

## 2017-07-09 DIAGNOSIS — I252 Old myocardial infarction: Secondary | ICD-10-CM

## 2017-07-09 DIAGNOSIS — I2510 Atherosclerotic heart disease of native coronary artery without angina pectoris: Secondary | ICD-10-CM | POA: Diagnosis not present

## 2017-07-09 DIAGNOSIS — Z7289 Other problems related to lifestyle: Secondary | ICD-10-CM

## 2017-07-09 DIAGNOSIS — E785 Hyperlipidemia, unspecified: Secondary | ICD-10-CM | POA: Diagnosis not present

## 2017-07-09 DIAGNOSIS — Z789 Other specified health status: Secondary | ICD-10-CM

## 2017-07-09 DIAGNOSIS — Z9861 Coronary angioplasty status: Secondary | ICD-10-CM

## 2017-07-09 DIAGNOSIS — I25709 Atherosclerosis of coronary artery bypass graft(s), unspecified, with unspecified angina pectoris: Secondary | ICD-10-CM

## 2017-07-09 HISTORY — DX: Atherosclerosis of coronary artery bypass graft(s), unspecified, with unspecified angina pectoris: I25.709

## 2017-07-09 NOTE — Progress Notes (Addendum)
Cardiology Office Note:    Date:  07/09/2017   ID:  Noah Walker, DOB 22-Aug-1950, MRN [id number]  PCP:  Titus DubinFutrell, Thomas M., MD  Cardiologist:  Garwin Brothersajan R Tricha Ruggirello, MD   Referring MD: Titus DubinFutrell, Thomas M., MD    ASSESSMENT:    1. Hyperlipidemia with target LDL less than 70   2. Coronary artery disease involving coronary bypass graft of native heart with angina pectoris (HCC)   3. Essential hypertension   4. CAD S/P percutaneous coronary angioplasty   5. Old MI (myocardial infarction)   6. Alcohol use    PLAN:    In order of problems listed above:  1. Secondary prevention stressed to the patient.  Importance of compliance with diet and medications stressed and he vocalized understanding.  Importance of regular exercise and walking stressed.  He was advised to walk at least 30 minutes a day 5 days a week and he agrees. 2. He does not want to be counseled against smoking and not respect his wishes and I told him however that he needs to quit to lower his cardiovascular risks.  He vocalized understanding.  Also I told him to practice abstinence from alcohol.  At best I said he could use some alcohol with moderation. 3. His blood pressure is stable.  He will have blood work today.  Will be seen in follow-up appointment in 3 months or earlier if he has any concerns. 4. The patient is not on statin therapy and also does not want to be on those medications because he mentions to me that he cannot tolerate statins.  We will evaluate his lipids.  I respect his wishes and will decide based on my lipid evaluation.   Medication Adjustments/Labs and Tests Ordered: Current medicines are reviewed at length with the patient today.  Concerns regarding medicines are outlined above.  Orders Placed This Encounter  Procedures  . Basic metabolic panel  . CBC with Differential/Platelet  . TSH  . Hepatic function panel  . Lipid panel   No orders of the defined types were placed in this  encounter.    Chief Complaint  Patient presents with  . Follow-up    1 month f/u     History of Present Illness:    Noah Walker is a 66 y.o. male.  Patient was evaluated by me after cardiac surgery.  He denies any problems at this time.  No chest pain orthopnea or PND.  He is wounds of surgery are healing well and he is active and walking on a regular basis.  Unfortunately he drinks a significant amount of beer and he tells me that he drank 6 beers yesterday and also continues to smoke but is trying to quit.  No other symptoms of cardiovascular standpoint  Past Medical History:  Diagnosis Date  . MI (myocardial infarction) (HCC)   . Reflux     Past Surgical History:  Procedure Laterality Date  . CARDIAC CATHETERIZATION    . CARDIAC CATHETERIZATION N/A 02/07/2015   Procedure: Left Heart Cath and Coronary Angiography;  Surgeon: Corky CraftsJayadeep S Varanasi, MD;  [facility];  Service: Cardiovascular;  Laterality: N/A;  . CARDIAC CATHETERIZATION  02/07/2015   Procedure: Coronary Balloon Angioplasty;  Surgeon: Corky CraftsJayadeep S Varanasi, MD;  [facility];  Service: Cardiovascular;;  . CORONARY ANGIOPLASTY    . CORONARY ARTERY BYPASS GRAFT N/A 05/22/2017   Procedure: CORONARY ARTERY BYPASS GRAFTING (CABG) X 2 WITH ENDOSCOPIC HARVESTING OF RIGHT SAPHENOUS  VEIN.LIMA-LAD SVG-RCA;  Surgeon: Donata Clay, Theron Arista, MD;  Location: [facility] OR;  Service: Open Heart Surgery;  Laterality: N/A;  . LEFT HEART CATH AND CORONARY ANGIOGRAPHY N/A 05/18/2017   Procedure: LEFT HEART CATH AND CORONARY ANGIOGRAPHY;  Surgeon: Dolores Patty, MD;  Location: [facility] INVASIVE CV LAB;  Service: Cardiovascular;  Laterality: N/A;  . TEE WITHOUT CARDIOVERSION N/A 05/22/2017   Procedure: TRANSESOPHAGEAL ECHOCARDIOGRAM (TEE);  Surgeon: Donata Clay, Theron Arista, MD;  Location[facility] OR;  Service: Open Heart Surgery;  Laterality: N/A;    Current Medications: Current Meds  Medication Sig  . aspirin 81 MG tablet Take  81 mg by mouth daily.  . Multiple Vitamin (MULTIVITAMIN WITH MINERALS) TABS tablet Take 1 tablet by mouth daily.  . Omega-3 Fatty Acids (FISH OIL) 1000 MG CAPS Take 1,000 mg by mouth daily.  Marland Kitchen oxyCODONE (OXY IR/ROXICODONE) 5 MG immediate release tablet Take 1-2 tablets (5-10 mg total) by mouth every 6 (six) hours as needed for severe pain.  . traZODone (DESYREL) 50 MG tablet Take 50-100 mg by mouth.  . vitamin C (ASCORBIC ACID) 250 MG tablet Take 250 mg by mouth daily.     Allergies:   Statins   Social History   Socioeconomic History  . Marital status: Unknown    Spouse name: None  . Number of children: None  . Years of education: None  . Highest education level: None  Social Needs  . Financial resource strain: None  . Food insecurity - worry: None  . Food insecurity - inability: None  . Transportation needs - medical: None  . Transportation needs - non-medical: None  Occupational History  . None  Tobacco Use  . Smoking status: Current Every Day Smoker    Packs/day: 0.50    Types: Cigarettes    Start date: 30  . Smokeless tobacco: Never Used  Substance and Sexual Activity  . Alcohol use: Yes  . Drug use: No  . Sexual activity: None  Other Topics Concern  . None  Social History Narrative  . None     Family History: The patient's family history is negative for CAD and Stroke.  ROS:   Please see the history of present illness.    All other systems reviewed and are negative.  EKGs/Labs/Other Studies Reviewed:    The following studies were reviewed today: I reviewed previous records and lab work with the patient at extensive length and he will have more blood work today which will be fasting.   Recent Labs: 05/20/2017: ALT 20; TSH 3.664 05/23/2017: Magnesium 2.2 05/25/2017: BUN 8; Creatinine, Ser 0.94; Hemoglobin 11.1; Platelets 114; Potassium 4.2; Sodium 131  Recent Lipid Panel    Component Value Date/Time   CHOL 192 05/19/2017 0539   TRIG 168 (H)  05/19/2017 0539   HDL 41 05/19/2017 0539   CHOLHDL 4.7 05/19/2017 0539   VLDL 34 05/19/2017 0539   LDLCALC 117 (H) 05/19/2017 0539    Physical Exam:    VS:  BP 118/60 (BP Location: Left Arm, Patient Position: Sitting)   Pulse 72   Ht 5\' 11"  (1.803 m)   Wt 211 lb (95.7 kg)   SpO2 97%   BMI 29.43 kg/m     Wt Readings from Last 3 Encounters:  07/09/17 211 lb (95.7 kg)  06/22/17 205 lb (93 kg)  06/09/17 213 lb (96.6 kg)     GEN: Patient is in no acute distress HEENT: Normal NECK: No JVD; No carotid bruits LYMPHATICS: No lymphadenopathy CARDIAC: Hear sounds  regular, 2/6 systolic murmur at the apex. RESPIRATORY:  Clear to auscultation without rales, wheezing or rhonchi  ABDOMEN: Soft, non-tender, non-distended MUSCULOSKELETAL:  No edema; No deformity  SKIN: Warm and dry NEUROLOGIC:  Alert and oriented x 3 PSYCHIATRIC:  Normal affect   Signed, Garwin Brothers, MD  07/09/2017 9:08 AM    [facility]

## 2017-07-09 NOTE — Patient Instructions (Signed)
Medication Instructions:  Your physician recommends that you continue on your current medications as directed. Please refer to the Current Medication list given to you today.  Labwork: Your physician recommends that you have the following labs drawn: CBC, BMP, TSH and liver/lipid panel  Testing/Procedures: None  Follow-Up: Your physician recommends that you schedule a follow-up appointment in: 3 months  Any Other Special Instructions Will Be Listed Below (If Applicable).     If you need a refill on your cardiac medications before your next appointment, please call your pharmacy.   [facility]  Garey Ham, RN, BSN

## 2017-07-10 ENCOUNTER — Other Ambulatory Visit: Payer: Self-pay

## 2017-07-10 LAB — BASIC METABOLIC PANEL
BUN/Creatinine Ratio: 13 (ref 10–24)
BUN: 13 mg/dL (ref 8–27)
CALCIUM: 9.6 mg/dL (ref 8.6–10.2)
CO2: 24 mmol/L (ref 20–29)
CREATININE: 1.02 mg/dL (ref 0.76–1.27)
Chloride: 102 mmol/L (ref 96–106)
GFR, EST AFRICAN AMERICAN: 89 mL/min/{1.73_m2} (ref >59)
GFR, EST NON AFRICAN AMERICAN: 77 mL/min/{1.73_m2} (ref >59)
Glucose: 98 mg/dL (ref 65–99)
Potassium: 4.6 mmol/L (ref 3.5–5.2)
Sodium: 138 mmol/L (ref 134–144)

## 2017-07-10 LAB — CBC WITH DIFFERENTIAL/PLATELET
BASOS: 0 %
Basophils Absolute: 0 10*3/uL (ref 0.0–0.2)
EOS (ABSOLUTE): 0.3 10*3/uL (ref 0.0–0.4)
EOS: 5 %
HEMATOCRIT: 45.7 % (ref 37.5–51.0)
HEMOGLOBIN: 15.8 g/dL (ref 13.0–17.7)
IMMATURE GRANS (ABS): 0 10*3/uL (ref 0.0–0.1)
IMMATURE GRANULOCYTES: 0 %
LYMPHS: 38 %
Lymphocytes Absolute: 2.5 10*3/uL (ref 0.7–3.1)
MCH: 33.5 pg — ABNORMAL HIGH (ref 26.6–33.0)
MCHC: 34.6 g/dL (ref 31.5–35.7)
MCV: 97 fL (ref 79–97)
Monocytes Absolute: 0.7 10*3/uL (ref 0.1–0.9)
Monocytes: 11 %
Neutrophils Absolute: 3.2 10*3/uL (ref 1.4–7.0)
Neutrophils: 46 %
PLATELETS: 218 10*3/uL (ref 150–379)
RBC: 4.72 x10E6/uL (ref 4.14–5.80)
RDW: 13.5 % (ref 12.3–15.4)
WBC: 6.8 10*3/uL (ref 3.4–10.8)

## 2017-07-10 LAB — HEPATIC FUNCTION PANEL
ALBUMIN: 4.4 g/dL (ref 3.6–4.8)
ALK PHOS: 81 IU/L (ref 39–117)
ALT: 16 IU/L (ref 0–44)
AST: 13 IU/L (ref 0–40)
BILIRUBIN TOTAL: 0.4 mg/dL (ref 0.0–1.2)
BILIRUBIN, DIRECT: 0.12 mg/dL (ref 0.00–0.40)
Total Protein: 7.4 g/dL (ref 6.0–8.5)

## 2017-07-10 LAB — LIPID PANEL
CHOL/HDL RATIO: 4.2 ratio (ref 0.0–5.0)
Cholesterol, Total: 180 mg/dL (ref 100–199)
HDL: 43 mg/dL (ref >39)
LDL Calculated: 105 mg/dL — ABNORMAL HIGH (ref 0–99)
Triglycerides: 161 mg/dL — ABNORMAL HIGH (ref 0–149)
VLDL Cholesterol Cal: 32 mg/dL (ref 5–40)

## 2017-07-10 LAB — TSH: TSH: 2.46 u[IU]/mL (ref 0.450–4.500)

## 2017-07-10 MED ORDER — EZETIMIBE 10 MG PO TABS: 10.0000 mg | ORAL_TABLET | Freq: Every day | ORAL | 0 refills | Status: DC

## 2017-07-10 NOTE — Addendum Note (Signed)
Addended by: Craige Cotta on: 07/10/2017 02:09 PM   Modules accepted: Orders

## 2017-07-15 ENCOUNTER — Telehealth: Payer: Self-pay | Admitting: Cardiology

## 2017-07-15 NOTE — Telephone Encounter (Signed)
1. Avoid all over-the-counter antihistamines except Claritin/Loratadine and Zyrtec/Cetrizine. 2. Avoid all combination including cold sinus allergies flu decongestant and sleep medications 3. You can use Robitussin DM Mucinex and Mucinex DM for cough. 4. can use Tylenol aspirin ibuprofen and naproxen but no combinations such as sleep or sinus.   Patient bought alka seltzer cold and flu with no antihistamines.

## 2017-07-15 NOTE — Telephone Encounter (Signed)
Has a bad cold and wants to know what he can and cannot take

## 2017-07-24 ENCOUNTER — Telehealth: Payer: Self-pay | Admitting: Cardiology

## 2017-07-24 NOTE — Telephone Encounter (Signed)
Referral for cardiac rehab will be made.

## 2017-07-24 NOTE — Telephone Encounter (Signed)
Has questions about rehab

## 2017-07-27 NOTE — Telephone Encounter (Signed)
Referral was made and patient was made aware.

## 2017-08-11 ENCOUNTER — Telehealth: Payer: Self-pay

## 2017-08-11 NOTE — Telephone Encounter (Signed)
Patient called stating that he had been sick on and off for the last 2 weeks and that it was now starting to make him vomit. Patient was informed to call his PCP to be seen as it may be something that could require physician treatment. Patient was agreeable and stated that he would call now for an appointment.

## 2017-09-29 LAB — HEPATIC FUNCTION PANEL
ALBUMIN: 4.5 g/dL (ref 3.6–4.8)
ALK PHOS: 80 IU/L (ref 39–117)
ALT: 12 IU/L (ref 0–44)
AST: 14 IU/L (ref 0–40)
Bilirubin Total: 0.6 mg/dL (ref 0.0–1.2)
Bilirubin, Direct: 0.16 mg/dL (ref 0.00–0.40)
TOTAL PROTEIN: 7.2 g/dL (ref 6.0–8.5)

## 2017-09-29 LAB — LIPID PANEL
CHOL/HDL RATIO: 4.3 ratio (ref 0.0–5.0)
Cholesterol, Total: 209 mg/dL — ABNORMAL HIGH (ref 100–199)
HDL: 49 mg/dL (ref >39)
LDL CALC: 123 mg/dL — AB (ref 0–99)
Triglycerides: 187 mg/dL — ABNORMAL HIGH (ref 0–149)
VLDL CHOLESTEROL CAL: 37 mg/dL (ref 5–40)

## 2017-10-07 ENCOUNTER — Encounter: Payer: Self-pay | Admitting: Cardiology

## 2017-10-07 ENCOUNTER — Ambulatory Visit (INDEPENDENT_AMBULATORY_CARE_PROVIDER_SITE_OTHER): Payer: Medicare Other | Admitting: Cardiology

## 2017-10-07 VITALS — BP 130/72 | HR 74 | Ht 71.0 in | Wt 215.0 lb

## 2017-10-07 DIAGNOSIS — F17200 Nicotine dependence, unspecified, uncomplicated: Secondary | ICD-10-CM | POA: Diagnosis not present

## 2017-10-07 DIAGNOSIS — Z9861 Coronary angioplasty status: Secondary | ICD-10-CM | POA: Diagnosis not present

## 2017-10-07 DIAGNOSIS — F17210 Nicotine dependence, cigarettes, uncomplicated: Secondary | ICD-10-CM | POA: Diagnosis not present

## 2017-10-07 DIAGNOSIS — Z951 Presence of aortocoronary bypass graft: Secondary | ICD-10-CM

## 2017-10-07 DIAGNOSIS — I2510 Atherosclerotic heart disease of native coronary artery without angina pectoris: Secondary | ICD-10-CM

## 2017-10-07 DIAGNOSIS — I25709 Atherosclerosis of coronary artery bypass graft(s), unspecified, with unspecified angina pectoris: Secondary | ICD-10-CM | POA: Diagnosis not present

## 2017-10-07 DIAGNOSIS — I10 Essential (primary) hypertension: Secondary | ICD-10-CM

## 2017-10-07 MED ORDER — NITROGLYCERIN 0.4 MG SL SUBL: 0.4000 mg | SUBLINGUAL_TABLET | SUBLINGUAL | 4 refills | Status: DC | PRN

## 2017-10-07 NOTE — Progress Notes (Signed)
Cardiology Office Note:    Date:  10/07/2017   ID:  Noah Walker, DOB 03/13/1951, MRN [id number]  PCP:  Titus Dubin., MD  Cardiologist:  Garwin Brothers, MD   Referring MD: Titus Dubin., MD    ASSESSMENT:    1. CAD S/P percutaneous coronary angioplasty   2. Coronary artery disease involving coronary bypass graft of native heart with angina pectoris (HCC)   3. Essential hypertension   4. Continuous dependence on cigarette smoking   5. S/P CABG (coronary artery bypass graft)   6. Smoking    PLAN:    In order of problems listed above:  1. Secondary prevention stressed with the patient.  Importance of compliance with diet and medications stressed and he vocalized understanding.  Importance of regular exercise stressed. 2. His blood pressure is stable.  Lipids were reviewed with him and I told him that he needs to back to better with diet.  He does not want to try any other lipid-lowering medication.  Risks were explained and I respect his wishes. 3. I spent 5 minutes with the patient discussing solely about smoking. Smoking cessation was counseled. I suggested to the patient also different medications and pharmacological interventions. Patient is keen to try stopping on its own at this time. He will get back to me if he needs any further assistance in this matter.   Medication Adjustments/Labs and Tests Ordered: Current medicines are reviewed at length with the patient today.  Concerns regarding medicines are outlined above.  No orders of the defined types were placed in this encounter.  Meds ordered this encounter  Medications  . nitroGLYCERIN (NITROSTAT) 0.4 MG SL tablet    Sig: Place 1 tablet (0.4 mg total) under the tongue every 5 (five) minutes as needed.    Dispense:  25 tablet    Refill:  4     Chief Complaint  Patient presents with  . Follow-up  . Coronary Artery Disease     History of Present Illness:    Noah Walker is a 67 y.o. male.  The  patient has known coronary artery disease and has undergone bypass surgery.  Patient has essential hypertension and unfortunately continues to smoke.  He leads a sedentary lifestyle.  No chest pain orthopnea or PND.  At the time of my evaluation, the patient is alert awake oriented and in no distress.  Past Medical History:  Diagnosis Date  . MI (myocardial infarction) (HCC)   . Reflux     Past Surgical History:  Procedure Laterality Date  . CARDIAC CATHETERIZATION    . CARDIAC CATHETERIZATION N/A 02/07/2015   Procedure: Left Heart Cath and Coronary Angiography;  Surgeon: Corky Crafts, MD;  Location: [facility] INVASIVE CV LAB;  Service: Cardiovascular;  Laterality: N/A;  . CARDIAC CATHETERIZATION  02/07/2015   Procedure: Coronary Balloon Angioplasty;  Surgeon: Corky Crafts, MD;  Location: [facility] INVASIVE CV LAB;  Service: Cardiovascular;;  . CORONARY ANGIOPLASTY    . CORONARY ARTERY BYPASS GRAFT N/A 05/22/2017   Procedure: CORONARY ARTERY BYPASS GRAFTING (CABG) X 2 WITH ENDOSCOPIC HARVESTING OF RIGHT SAPHENOUS VEIN.LIMA-LAD SVG-RCA;  Surgeon: Donata Clay, Theron Arista, MD;  Location: [facility] OR;  Service: Open Heart Surgery;  Laterality: N/A;  . LEFT HEART CATH AND CORONARY ANGIOGRAPHY N/A 05/18/2017   Procedure: LEFT HEART CATH AND CORONARY ANGIOGRAPHY;  Surgeon: Dolores Patty, MD;  Location: [facility] INVASIVE CV LAB;  Service: Cardiovascular;  Laterality: N/A;  . TEE WITHOUT CARDIOVERSION N/A 05/22/2017  Procedure: TRANSESOPHAGEAL ECHOCARDIOGRAM (TEE);  Surgeon: Donata Clay, Theron Arista, MD;  Location: [facility] OR;  Service: Open Heart Surgery;  Laterality: N/A;    Current Medications: Current Meds  Medication Sig  . ALPRAZolam (XANAX) 0.25 MG tablet Take 1 tablet (0.25 mg total) at bedtime as needed by mouth for anxiety.  Marland Kitchen aspirin 81 MG tablet Take 81 mg by mouth daily.  Marland Kitchen ezetimibe (ZETIA) 10 MG tablet Take 1 tablet (10 mg total) by mouth daily.  . Multiple Vitamin (MULTIVITAMIN WITH MINERALS) TABS tablet  Take 1 tablet by mouth daily.  . nicotine (NICODERM CQ - DOSED IN MG/24 HOURS) 14 mg/24hr patch Place 1 patch (14 mg total) daily onto the skin.  Marland Kitchen omega-3 acid ethyl esters (LOVAZA) 1 g capsule Take by mouth.  . Omega-3 Fatty Acids (FISH OIL) 1000 MG CAPS Take 1,000 mg by mouth daily.  Marland Kitchen oxyCODONE (OXY IR/ROXICODONE) 5 MG immediate release tablet Take 1-2 tablets (5-10 mg total) by mouth every 6 (six) hours as needed for severe pain.  . traZODone (DESYREL) 50 MG tablet Take 50-100 mg by mouth.  . vitamin C (ASCORBIC ACID) 250 MG tablet Take 250 mg by mouth daily.     Allergies:   Statins   Social History   Socioeconomic History  . Marital status: Unknown    Spouse name: None  . Number of children: None  . Years of education: None  . Highest education level: None  Social Needs  . Financial resource strain: None  . Food insecurity - worry: None  . Food insecurity - inability: None  . Transportation needs - medical: None  . Transportation needs - non-medical: None  Occupational History  . None  Tobacco Use  . Smoking status: Current Every Day Smoker    Packs/day: 0.50    Types: Cigarettes    Start date: 65  . Smokeless tobacco: Never Used  Substance and Sexual Activity  . Alcohol use: Yes  . Drug use: No  . Sexual activity: None  Other Topics Concern  . None  Social History Narrative  . None     Family History: The patient's family history is negative for CAD and Stroke.  ROS:   Please see the history of present illness.    All other systems reviewed and are negative.  EKGs/Labs/Other Studies Reviewed:    The following studies were reviewed today: I discussed his lab work with him extensively.  He does not want to try any more lipid-lowering therapy of any sort.  I respect his wishes.  Risks explained to him.   Recent Labs: 05/23/2017: Magnesium 2.2 07/09/2017: BUN 13; Creatinine, Ser 1.02; Hemoglobin 15.8; Platelets 218; Potassium 4.6; Sodium 138; TSH  2.460 09/28/2017: ALT 12  Recent Lipid Panel    Component Value Date/Time   CHOL 209 (H) 09/28/2017 0841   TRIG 187 (H) 09/28/2017 0841   HDL 49 09/28/2017 0841   CHOLHDL 4.3 09/28/2017 0841   CHOLHDL 4.7 05/19/2017 0539   VLDL 34 05/19/2017 0539   LDLCALC 123 (H) 09/28/2017 0841    Physical Exam:    VS:  BP 130/72 (BP Location: Left Arm, Patient Position: Sitting, Cuff Size: Normal)   Pulse 74   Ht 5\' 11"  (1.803 m)   Wt 215 lb (97.5 kg)   SpO2 98%   BMI 29.99 kg/m     Wt Readings from Last 3 Encounters:  10/07/17 215 lb (97.5 kg)  07/09/17 211 lb (95.7 kg)  06/22/17 205 lb (93 kg)  GEN: Patient is in no acute distress HEENT: Normal NECK: No JVD; No carotid bruits LYMPHATICS: No lymphadenopathy CARDIAC: Hear sounds regular, 2/6 systolic murmur at the apex. RESPIRATORY:  Clear to auscultation without rales, wheezing or rhonchi  ABDOMEN: Soft, non-tender, non-distended MUSCULOSKELETAL:  No edema; No deformity  SKIN: Warm and dry NEUROLOGIC:  Alert and oriented x 3 PSYCHIATRIC:  Normal affect   Signed, Garwin Brothers, MD  10/07/2017 8:58 AM    [facility]

## 2017-10-07 NOTE — Patient Instructions (Signed)
Medication Instructions:  Your physician has recommended you make the following change in your medication:  START Nitroglycerin 0.4 mg sublingual (under your tongue) as needed for chest pain. If experiencing chest pain, stop what you are doing and sit down. Take 1 nitroglycerin and wait 5 minutes. If chest pain continues, take another nitroglycerin and wait 5 minutes. If chest pain does not subside, take 1 more nitroglycerin and dial 911. You make take a total of 3 nitroglycerin in a 15 minute time frame  Labwork: None  Testing/Procedures: None  Follow-Up: Your physician recommends that you schedule a follow-up appointment in: 6 months  Any Other Special Instructions Will Be Listed Below (If Applicable).     If you need a refill on your cardiac medications before your next appointment, please call your pharmacy.   [facility]  Ramonica Grigg A, RN, BSN  

## 2017-11-13 ENCOUNTER — Ambulatory Visit: Payer: Medicare Other | Admitting: Cardiology

## 2017-11-13 DIAGNOSIS — R0989 Other specified symptoms and signs involving the circulatory and respiratory systems: Secondary | ICD-10-CM

## 2018-03-25 ENCOUNTER — Ambulatory Visit (INDEPENDENT_AMBULATORY_CARE_PROVIDER_SITE_OTHER): Payer: Medicare Other | Admitting: Cardiology

## 2018-03-25 ENCOUNTER — Encounter: Payer: Self-pay | Admitting: Cardiology

## 2018-03-25 VITALS — BP 124/80 | HR 86 | Ht 71.0 in | Wt 213.8 lb

## 2018-03-25 DIAGNOSIS — I2510 Atherosclerotic heart disease of native coronary artery without angina pectoris: Secondary | ICD-10-CM

## 2018-03-25 DIAGNOSIS — Z951 Presence of aortocoronary bypass graft: Secondary | ICD-10-CM

## 2018-03-25 DIAGNOSIS — Z9861 Coronary angioplasty status: Secondary | ICD-10-CM

## 2018-03-25 DIAGNOSIS — I10 Essential (primary) hypertension: Secondary | ICD-10-CM

## 2018-03-25 DIAGNOSIS — I200 Unstable angina: Secondary | ICD-10-CM | POA: Diagnosis not present

## 2018-03-25 DIAGNOSIS — I25709 Atherosclerosis of coronary artery bypass graft(s), unspecified, with unspecified angina pectoris: Secondary | ICD-10-CM

## 2018-03-25 DIAGNOSIS — F17200 Nicotine dependence, unspecified, uncomplicated: Secondary | ICD-10-CM

## 2018-03-25 DIAGNOSIS — E785 Hyperlipidemia, unspecified: Secondary | ICD-10-CM

## 2018-03-25 NOTE — Patient Instructions (Signed)
Medication Instructions:  Your physician recommends that you continue on your current medications as directed. Please refer to the Current Medication list given to you today.  Labwork: Your physician recommends that you have the following labs drawn: CBC, BMP, TSH, liver and lipid panel.  Testing/Procedures: None  Follow-Up: Your physician recommends that you schedule a follow-up appointment in: 6 months  Any Other Special Instructions Will Be Listed Below (If Applicable).     If you need a refill on your cardiac medications before your next appointment, please call your pharmacy.   [facility]  Ashley A, RN, BSN  

## 2018-03-25 NOTE — Progress Notes (Signed)
Cardiology Office Note:    Date:  03/25/2018   ID:  Noah Walker, DOB Jan 21, 1951, MRN [id number]  PCP:  Titus Dubin., MD  Cardiologist:  Garwin Brothers, MD   Referring MD: Titus Dubin., MD    ASSESSMENT:    1. Coronary artery disease involving coronary bypass graft of native heart with angina pectoris (HCC)   2. Essential hypertension   3. CAD S/P percutaneous coronary angioplasty   4. Unstable angina (HCC)   5. S/P CABG (coronary artery bypass graft)   6. Hyperlipidemia with target LDL less than 70 - Reluctant to take medications   7. Smoking    PLAN:    In order of problems listed above:  1. Secondary prevention stressed with the patient.  Importance of compliance with diet and medication stressed and he vocalized understanding.  I advised the patient to quit smoking and that it would be better for his health and cardiovascular status. I explained this at length to the patient and he vocalized understanding. 2. Lipid-lowering medications were emphasized he will come back tomorrow morning for blood work and we will initiate Zetia 10 mg daily.  Then he will be dieting and coming back for liver lipid check in 6 weeks. 3. Patient will be seen in follow-up appointment in 6 months or earlier if the patient has any concerns    Medication Adjustments/Labs and Tests Ordered: Current medicines are reviewed at length with the patient today.  Concerns regarding medicines are outlined above.  Orders Placed This Encounter  Procedures  . Basic metabolic panel  . CBC with Differential/Platelet  . TSH  . Hepatic function panel  . Lipid panel   No orders of the defined types were placed in this encounter.    Chief Complaint  Patient presents with  . Follow-up    6 months     History of Present Illness:    Noah Walker is a 67 y.o. male.  Patient has known coronary artery disease and has undergone coronary stenting.  He denies any problems at this time.  No chest  pain orthopnea or PND.  He is not taking his lipid-lowering medications and decided that by himself.  Unfortunately continues to smoke.  At the time of my evaluation, the patient is alert awake oriented and in no distress.  Past Medical History:  Diagnosis Date  . MI (myocardial infarction) (HCC)   . Reflux     Past Surgical History:  Procedure Laterality Date  . CARDIAC CATHETERIZATION    . CARDIAC CATHETERIZATION N/A 02/07/2015   Procedure: Left Heart Cath and Coronary Angiography;  Surgeon: Corky Crafts, MD;  Location: [facility] INVASIVE CV LAB;  Service: Cardiovascular;  Laterality: N/A;  . CARDIAC CATHETERIZATION  02/07/2015   Procedure: Coronary Balloon Angioplasty;  Surgeon: Corky Crafts, MD;  Location: [facility] INVASIVE CV LAB;  Service: Cardiovascular;;  . CORONARY ANGIOPLASTY    . CORONARY ARTERY BYPASS GRAFT N/A 05/22/2017   Procedure: CORONARY ARTERY BYPASS GRAFTING (CABG) X 2 WITH ENDOSCOPIC HARVESTING OF RIGHT SAPHENOUS VEIN.LIMA-LAD SVG-RCA;  Surgeon: Donata Clay, Theron Arista, MD;  Location: [facility] OR;  Service: Open Heart Surgery;  Laterality: N/A;  . LEFT HEART CATH AND CORONARY ANGIOGRAPHY N/A 05/18/2017   Procedure: LEFT HEART CATH AND CORONARY ANGIOGRAPHY;  Surgeon: Dolores Patty, MD;  Location: [facility] INVASIVE CV LAB;  Service: Cardiovascular;  Laterality: N/A;  . TEE WITHOUT CARDIOVERSION N/A 05/22/2017   Procedure: TRANSESOPHAGEAL ECHOCARDIOGRAM (TEE);  Surgeon: Donata Clay, Theron Arista, MD;  Location: [facility] OR;  Service: Open Heart Surgery;  Laterality: N/A;    Current Medications: Current Meds  Medication Sig  . aspirin 81 MG tablet Take 81 mg by mouth daily.  . baclofen (LIORESAL) 10 MG tablet Take 10 mg by mouth 3 (three) times daily.  Marland Kitchen ibuprofen (ADVIL,MOTRIN) 800 MG tablet Take 800 mg by mouth every 6 (six) hours as needed.  . Multiple Vitamin (MULTIVITAMIN WITH MINERALS) TABS tablet Take 1 tablet by mouth daily.  . Omega-3 Fatty Acids (FISH OIL) 1000 MG CAPS Take 1,000 mg by  mouth daily.  . vitamin C (ASCORBIC ACID) 250 MG tablet Take 250 mg by mouth daily.     Allergies:   Statins   Social History   Socioeconomic History  . Marital status: Unknown    Spouse name: Not on file  . Number of children: Not on file  . Years of education: Not on file  . Highest education level: Not on file  Occupational History  . Not on file  Social Needs  . Financial resource strain: Not on file  . Food insecurity:    Worry: Not on file    Inability: Not on file  . Transportation needs:    Medical: Not on file    Non-medical: Not on file  Tobacco Use  . Smoking status: Current Every Day Smoker    Packs/day: 0.50    Types: Cigarettes    Start date: 46  . Smokeless tobacco: Never Used  Substance and Sexual Activity  . Alcohol use: Yes  . Drug use: No  . Sexual activity: Not on file  Lifestyle  . Physical activity:    Days per week: Not on file    Minutes per session: Not on file  . Stress: Not on file  Relationships  . Social connections:    Talks on phone: Not on file    Gets together: Not on file    Attends religious service: Not on file    Active member of club or organization: Not on file    Attends meetings of clubs or organizations: Not on file    Relationship status: Not on file  Other Topics Concern  . Not on file  Social History Narrative  . Not on file     Family History: The patient's family history is negative for CAD and Stroke.  ROS:   Please see the history of present illness.    All other systems reviewed and are negative.  EKGs/Labs/Other Studies Reviewed:    The following studies were reviewed today: I discussed my findings with the patient at extensive length.   Recent Labs: 05/23/2017: Magnesium 2.2 07/09/2017: BUN 13; Creatinine, Ser 1.02; Hemoglobin 15.8; Platelets 218; Potassium 4.6; Sodium 138; TSH 2.460 09/28/2017: ALT 12  Recent Lipid Panel    Component Value Date/Time   CHOL 209 (H) 09/28/2017 0841   TRIG 187  (H) 09/28/2017 0841   HDL 49 09/28/2017 0841   CHOLHDL 4.3 09/28/2017 0841   CHOLHDL 4.7 05/19/2017 0539   VLDL 34 05/19/2017 0539   LDLCALC 123 (H) 09/28/2017 0841    Physical Exam:    VS:  BP 124/80 (BP Location: Left Arm, Patient Position: Sitting)   Pulse 86   Ht 5\' 11"  (1.803 m)   Wt 213 lb 12.8 oz (97 kg)   SpO2 96%   BMI 29.82 kg/m     Wt Readings from Last 3 Encounters:  03/25/18 213 lb 12.8 oz (97 kg)  10/07/17 215  lb (97.5 kg)  07/09/17 211 lb (95.7 kg)     GEN: Patient is in no acute distress HEENT: Normal NECK: No JVD; No carotid bruits LYMPHATICS: No lymphadenopathy CARDIAC: Hear sounds regular, 2/6 systolic murmur at the apex. RESPIRATORY:  Clear to auscultation without rales, wheezing or rhonchi  ABDOMEN: Soft, non-tender, non-distended MUSCULOSKELETAL:  No edema; No deformity  SKIN: Warm and dry NEUROLOGIC:  Alert and oriented x 3 PSYCHIATRIC:  Normal affect   Signed, Garwin Brothers, MD  03/25/2018 2:59 PM    [facility]

## 2018-06-16 IMAGING — CR DG CHEST 1V PORT
1 series · 1 of 1 positions shown · non-contrast
Comparison: 05/23/2017

CLINICAL DATA: Postop check heart sx

EXAM:
PORTABLE CHEST 1 VIEW

[AP]
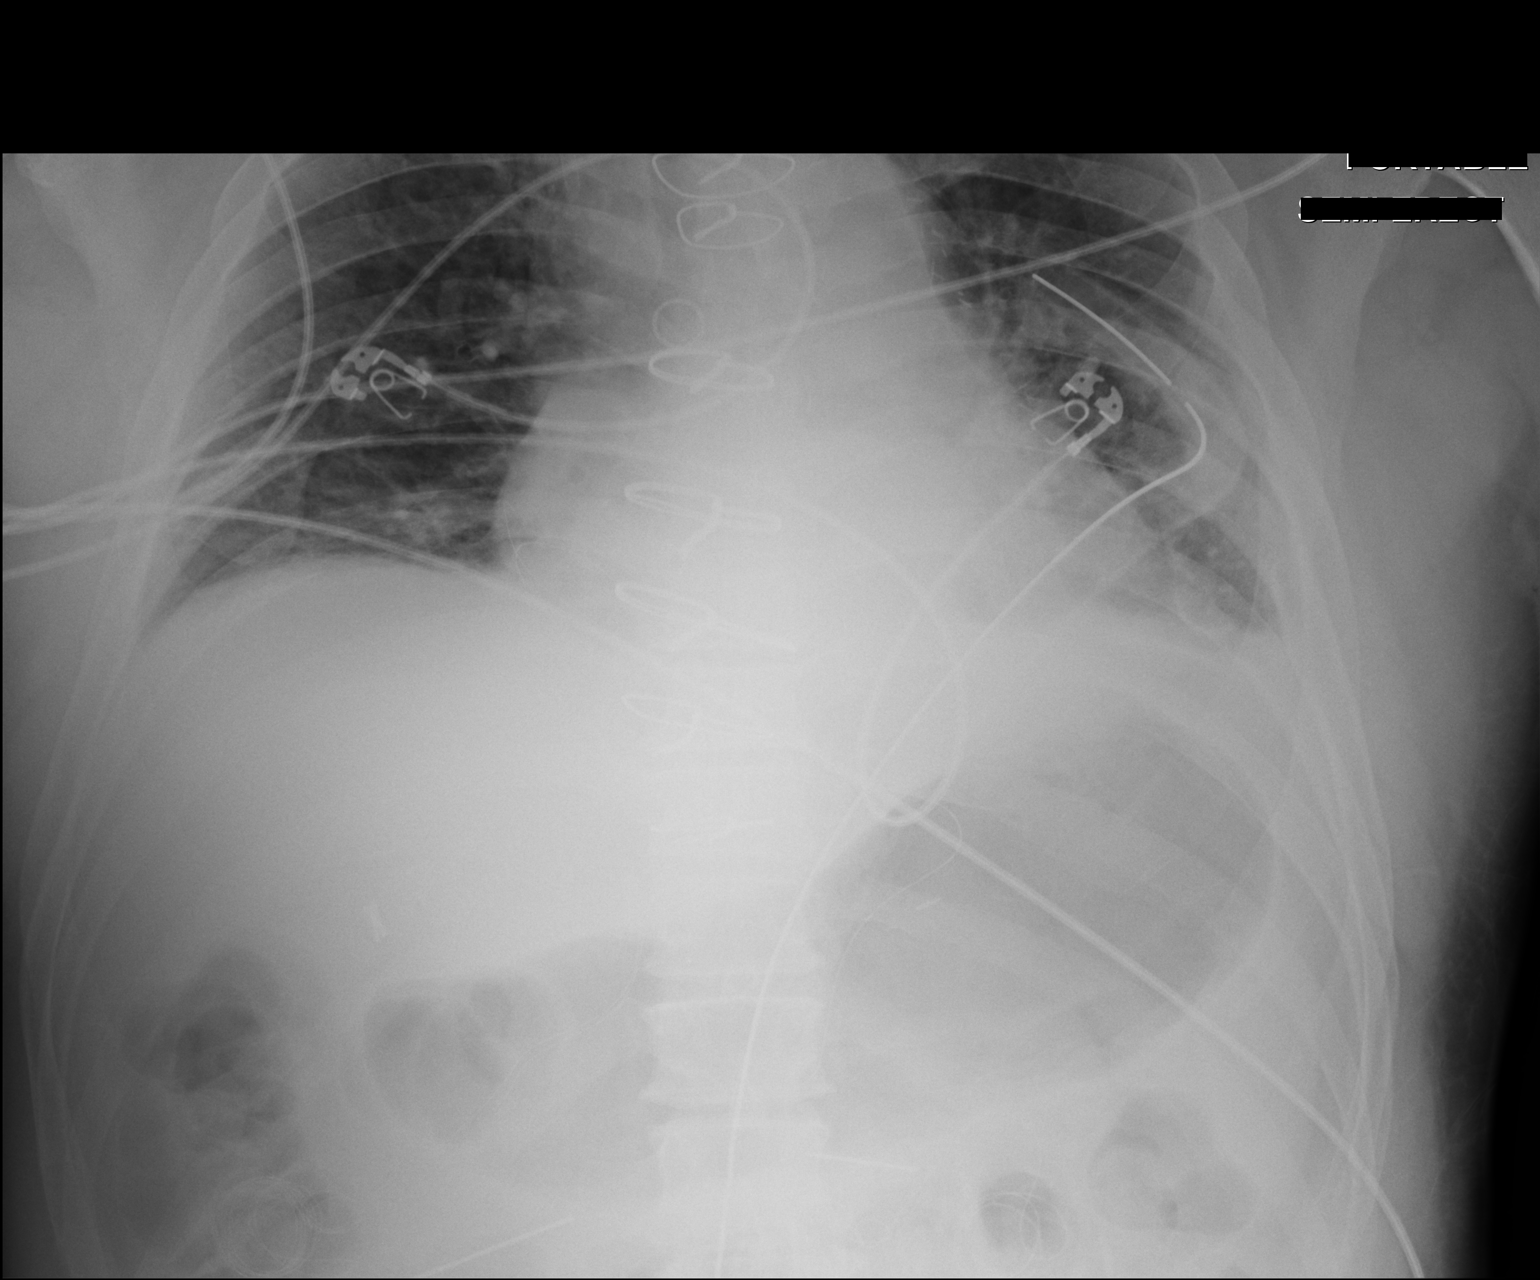

[1 of 1 positions shown; findings below may reference images not displayed]

FINDINGS: Swan-Ganz catheter has been removed. Right IJ sheath remains in
place, tip overlying the level superior vena cava. Mediastinal drain
has been removed. A left-sided chest tube remains in place. There is
no evidence for pneumothorax. Minimal bibasilar atelectasis is
present. Suspect trace left pleural effusion. There is mild gaseous
distension of the stomach.
IMPRESSION: 1. Interval removal of Swan-Ganz catheter and mediastinal drain.
2. Persistent left-sided chest tube without evidence for
pneumothorax.

## 2019-07-27 ENCOUNTER — Telehealth: Payer: Self-pay | Admitting: Cardiology

## 2019-07-27 ENCOUNTER — Other Ambulatory Visit: Payer: Self-pay | Admitting: *Deleted

## 2019-07-27 MED ORDER — NITROGLYCERIN 0.4 MG SL SUBL: 0.4000 mg | SUBLINGUAL_TABLET | SUBLINGUAL | 4 refills | Status: DC | PRN

## 2019-07-27 NOTE — Telephone Encounter (Signed)
Refill sent.

## 2019-07-27 NOTE — Telephone Encounter (Signed)
New Message    *STAT* If patient is at the pharmacy, call can be transferred to refill team.   1. Which medications need to be refilled? (please list name of each medication and dose if known) nitroGLYCERIN (NITROSTAT) 0.4 MG SL tablet    2. Which pharmacy/location (including street and city if local pharmacy) is medication to be sent to? [place], [place] - [address]  3. Do they need a 30 day or 90 day supply? [place]

## 2019-08-01 ENCOUNTER — Encounter: Payer: Self-pay | Admitting: Cardiology

## 2019-08-01 ENCOUNTER — Ambulatory Visit (INDEPENDENT_AMBULATORY_CARE_PROVIDER_SITE_OTHER): Payer: Medicare Other | Admitting: Cardiology

## 2019-08-01 ENCOUNTER — Other Ambulatory Visit: Payer: Self-pay

## 2019-08-01 VITALS — BP 144/92 | HR 70 | Ht 71.0 in | Wt 225.0 lb

## 2019-08-01 DIAGNOSIS — Z951 Presence of aortocoronary bypass graft: Secondary | ICD-10-CM | POA: Diagnosis not present

## 2019-08-01 DIAGNOSIS — Z9861 Coronary angioplasty status: Secondary | ICD-10-CM

## 2019-08-01 DIAGNOSIS — Z1329 Encounter for screening for other suspected endocrine disorder: Secondary | ICD-10-CM

## 2019-08-01 DIAGNOSIS — Z7289 Other problems related to lifestyle: Secondary | ICD-10-CM

## 2019-08-01 DIAGNOSIS — E785 Hyperlipidemia, unspecified: Secondary | ICD-10-CM

## 2019-08-01 DIAGNOSIS — F17200 Nicotine dependence, unspecified, uncomplicated: Secondary | ICD-10-CM | POA: Diagnosis not present

## 2019-08-01 DIAGNOSIS — I10 Essential (primary) hypertension: Secondary | ICD-10-CM

## 2019-08-01 DIAGNOSIS — Z789 Other specified health status: Secondary | ICD-10-CM

## 2019-08-01 DIAGNOSIS — I2510 Atherosclerotic heart disease of native coronary artery without angina pectoris: Secondary | ICD-10-CM

## 2019-08-01 NOTE — Patient Instructions (Signed)
Medication Instructions:  Your physician recommends that you continue on your current medications as directed. Please refer to the Current Medication list given to you today.  *If you need a refill on your cardiac medications before your next appointment, please call your pharmacy*  Lab Work: You will need to come back tomorrow FASTING for repeat labs  If you have labs (blood work) drawn today and your tests are completely normal, you will receive your results only by: Marland Kitchen MyChart Message (if you have MyChart) OR . A paper copy in the mail If you have any lab test that is abnormal or we need to change your treatment, we will call you to review the results.  Testing/Procedures: You had an EKG performed today  Follow-Up: At [facility], you and your health needs are our priority.  As part of our continuing mission to provide you with exceptional heart care, we have created designated Provider Care Teams.  These Care Teams include your primary Cardiologist (physician) and Advanced Practice Providers (APPs -  Physician Assistants and Nurse Practitioners) who all work together to provide you with the care you need, when you need it.  Your next appointment:   6 month(s)  The format for your next appointment:   In Person  Provider:   Belva Crome, MD

## 2019-08-01 NOTE — Progress Notes (Signed)
Cardiology Office Note:    Date:  08/01/2019   ID:  Noah Walker, DOB 1951-01-01, MRN [id number]  PCP:  Titus Dubin., MD  Cardiologist:  Garwin Brothers, MD   Referring MD: Titus Dubin., MD    ASSESSMENT:    1. CAD S/P percutaneous coronary angioplasty   2. Hyperlipidemia with target LDL less than 70 - Reluctant to take medications   3. Essential hypertension   4. Smoking   5. Alcohol use   6. S/P CABG (coronary artery bypass graft)    PLAN:    In order of problems listed above:  1. Coronary artery disease: Secondary prevention stressed with the patient.  Importance of compliance with diet and medication stressed and he vocalized understanding.  Importance of regular exercise stressed.  I am not sure whether he is going to comply. 2. Mixed dyslipidemia: He needs to come for blood work he has not kept those appointments in the past.  He promises to come back in the future.  He is reluctant and vehemently against statin therapy.  I respect his wishes.  Risks explained to the patient and he vocalized understanding. 3. Cigarette smoker: I discussed about cessation and he is not keen on quitting at this time.  I discussed risks I respect his wishes. 4. He will be back in the next few days for blood work and he promises to do so.Patient will be seen in follow-up appointment in 6 months or earlier if the patient has any concerns    Medication Adjustments/Labs and Tests Ordered: Current medicines are reviewed at length with the patient today.  Concerns regarding medicines are outlined above.  No orders of the defined types were placed in this encounter.  No orders of the defined types were placed in this encounter.    Chief Complaint  Patient presents with  . 6 month follow up     History of Present Illness:    Noah Walker is a 69 y.o. male.  Patient has past medical history of coronary artery disease post CABG surgery, essential hypertension and dyslipidemia.   Unfortunately continues to smoke.  He has been noncompliant with exercise and smoking it was in the past.  He is here for follow-up.  He refuses to take statin therapy.  At the time of my evaluation, the patient is alert awake oriented and in no distress.  Past Medical History:  Diagnosis Date  . MI (myocardial infarction) (HCC)   . Reflux     Past Surgical History:  Procedure Laterality Date  . CARDIAC CATHETERIZATION    . CARDIAC CATHETERIZATION N/A 02/07/2015   Procedure: Left Heart Cath and Coronary Angiography;  Surgeon: Corky Crafts, MD;  Location: [facility] INVASIVE CV LAB;  Service: Cardiovascular;  Laterality: N/A;  . CARDIAC CATHETERIZATION  02/07/2015   Procedure: Coronary Balloon Angioplasty;  Surgeon: Corky Crafts, MD;  Location: [facility] INVASIVE CV LAB;  Service: Cardiovascular;;  . CORONARY ANGIOPLASTY    . CORONARY ARTERY BYPASS GRAFT N/A 05/22/2017   Procedure: CORONARY ARTERY BYPASS GRAFTING (CABG) X 2 WITH ENDOSCOPIC HARVESTING OF RIGHT SAPHENOUS VEIN.LIMA-LAD SVG-RCA;  Surgeon: Donata Clay, Theron Arista, MD;  Location: [facility] OR;  Service: Open Heart Surgery;  Laterality: N/A;  . LEFT HEART CATH AND CORONARY ANGIOGRAPHY N/A 05/18/2017   Procedure: LEFT HEART CATH AND CORONARY ANGIOGRAPHY;  Surgeon: Dolores Patty, MD;  Location: [facility] INVASIVE CV LAB;  Service: Cardiovascular;  Laterality: N/A;  . TEE WITHOUT CARDIOVERSION N/A 05/22/2017  Procedure: TRANSESOPHAGEAL ECHOCARDIOGRAM (TEE);  Surgeon: Donata Clay, Theron Arista, MD;  Location: [facility] OR;  Service: Open Heart Surgery;  Laterality: N/A;    Current Medications: Current Meds  Medication Sig  . aspirin 81 MG tablet Take 81 mg by mouth daily.  . Omega-3 Fatty Acids (FISH OIL) 1000 MG CAPS Take 1,000 mg by mouth daily.     Allergies:   Statins   Social History   Socioeconomic History  . Marital status: Unknown    Spouse name: Not on file  . Number of children: Not on file  . Years of education: Not on file  . Highest  education level: Not on file  Occupational History  . Not on file  Tobacco Use  . Smoking status: Current Every Day Smoker    Packs/day: 0.50    Types: Cigarettes    Start date: 96  . Smokeless tobacco: Never Used  Substance and Sexual Activity  . Alcohol use: Yes  . Drug use: No  . Sexual activity: Not on file  Other Topics Concern  . Not on file  Social History Narrative  . Not on file   Social Determinants of Health   Financial Resource Strain:   . Difficulty of Paying Living Expenses: Not on file  Food Insecurity:   . Worried About Programme researcher, broadcasting/film/video in the Last Year: Not on file  . Ran Out of Food in the Last Year: Not on file  Transportation Needs:   . Lack of Transportation (Medical): Not on file  . Lack of Transportation (Non-Medical): Not on file  Physical Activity:   . Days of Exercise per Week: Not on file  . Minutes of Exercise per Session: Not on file  Stress:   . Feeling of Stress : Not on file  Social Connections:   . Frequency of Communication with Friends and Family: Not on file  . Frequency of Social Gatherings with Friends and Family: Not on file  . Attends Religious Services: Not on file  . Active Member of Clubs or Organizations: Not on file  . Attends Banker Meetings: Not on file  . Marital Status: Not on file     Family History: The patient's family history is negative for CAD and Stroke.  ROS:   Please see the history of present illness.    All other systems reviewed and are negative.  EKGs/Labs/Other Studies Reviewed:    The following studies were reviewed today: EKG reveals sinus rhythm and nonspecific ST-T changes.   Recent Labs: No results found for requested labs within last 8760 hours.  Recent Lipid Panel    Component Value Date/Time   CHOL 209 (H) 09/28/2017 0841   TRIG 187 (H) 09/28/2017 0841   HDL 49 09/28/2017 0841   CHOLHDL 4.3 09/28/2017 0841   CHOLHDL 4.7 05/19/2017 0539   VLDL 34 05/19/2017 0539    LDLCALC 123 (H) 09/28/2017 0841    Physical Exam:    VS:  BP (!) 144/92   Pulse 70   Ht 5\' 11"  (1.803 m)   Wt 225 lb (102.1 kg)   SpO2 96%   BMI 31.38 kg/m     Wt Readings from Last 3 Encounters:  08/01/19 225 lb (102.1 kg)  03/25/18 213 lb 12.8 oz (97 kg)  10/07/17 215 lb (97.5 kg)     GEN: Patient is in no acute distress HEENT: Normal NECK: No JVD; No carotid bruits LYMPHATICS: No lymphadenopathy CARDIAC: Hear sounds regular, 2/6 systolic murmur at  the apex. RESPIRATORY:  Clear to auscultation without rales, wheezing or rhonchi  ABDOMEN: Soft, non-tender, non-distended MUSCULOSKELETAL:  No edema; No deformity  SKIN: Warm and dry NEUROLOGIC:  Alert and oriented x 3 PSYCHIATRIC:  Normal affect   Signed, Jenean Lindau, MD  08/01/2019 9:45 AM    [place]

## 2019-08-02 LAB — HEPATIC FUNCTION PANEL
ALT: 18 IU/L (ref 0–44)
AST: 14 IU/L (ref 0–40)
Albumin: 4.3 g/dL (ref 3.8–4.8)
Alkaline Phosphatase: 77 IU/L (ref 39–117)
Bilirubin Total: 0.4 mg/dL (ref 0.0–1.2)
Bilirubin, Direct: 0.12 mg/dL (ref 0.00–0.40)
Total Protein: 6.3 g/dL (ref 6.0–8.5)

## 2019-08-02 LAB — CBC
Hematocrit: 45.2 % (ref 37.5–51.0)
Hemoglobin: 15.3 g/dL (ref 13.0–17.7)
MCH: 33.4 pg — ABNORMAL HIGH (ref 26.6–33.0)
MCHC: 33.8 g/dL (ref 31.5–35.7)
MCV: 99 fL — ABNORMAL HIGH (ref 79–97)
Platelets: 167 10*3/uL (ref 150–450)
RBC: 4.58 x10E6/uL (ref 4.14–5.80)
RDW: 12.4 % (ref 11.6–15.4)
WBC: 5.8 10*3/uL (ref 3.4–10.8)

## 2019-08-02 LAB — BASIC METABOLIC PANEL
BUN/Creatinine Ratio: 10 (ref 10–24)
BUN: 11 mg/dL (ref 8–27)
CO2: 24 mmol/L (ref 20–29)
Calcium: 9.1 mg/dL (ref 8.6–10.2)
Chloride: 101 mmol/L (ref 96–106)
Creatinine, Ser: 1.08 mg/dL (ref 0.76–1.27)
GFR calc Af Amer: 82 mL/min/{1.73_m2} (ref >59)
GFR calc non Af Amer: 71 mL/min/{1.73_m2} (ref >59)
Glucose: 99 mg/dL (ref 65–99)
Potassium: 4 mmol/L (ref 3.5–5.2)
Sodium: 137 mmol/L (ref 134–144)

## 2019-08-02 LAB — LIPID PANEL
Chol/HDL Ratio: 4.4 ratio (ref 0.0–5.0)
Cholesterol, Total: 188 mg/dL (ref 100–199)
HDL: 43 mg/dL (ref >39)
LDL Chol Calc (NIH): 123 mg/dL — ABNORMAL HIGH (ref 0–99)
Triglycerides: 123 mg/dL (ref 0–149)
VLDL Cholesterol Cal: 22 mg/dL (ref 5–40)

## 2019-08-02 LAB — TSH: TSH: 3.33 u[IU]/mL (ref 0.450–4.500)

## 2019-08-02 NOTE — Addendum Note (Signed)
Addended by: Pamala Hurry on: 08/02/2019 08:09 AM   Modules accepted: Orders

## 2019-08-02 NOTE — Addendum Note (Signed)
Addended by: Pamala Hurry on: 08/02/2019 08:11 AM   Modules accepted: Orders

## 2020-03-09 ENCOUNTER — Telehealth: Payer: Self-pay | Admitting: Cardiology

## 2020-03-09 NOTE — Telephone Encounter (Signed)
Is the patient on any blood pressure medicine

## 2020-03-09 NOTE — Telephone Encounter (Signed)
Pt c/o BP issue: STAT if pt c/o blurred vision, one-sided weakness or slurred speech  1. What are your last 5 BP readings?  03/08/20: 144/90 03/09/20: 155/99  2. Are you having any other symptoms (ex. Dizziness, headache, blurred vision, passed out)? Headache, fatigue, congestion, doesn't feel like doing anything except sleeping  3. What is your BP issue? Pt is concerned that his BP is high for him. He has been in bed with a head cold but no fever most of the week.

## 2020-03-12 NOTE — Telephone Encounter (Signed)
Pt is on no BP medications.

## 2020-03-13 NOTE — Telephone Encounter (Signed)
Message left for pt to callback and update [place] with a new BP/HR and if he is taking any type of BP medication that is not listed.

## 2020-03-13 NOTE — Telephone Encounter (Signed)
Give me fresh set of vitals also HR And curernt bp meds

## 2020-04-11 DIAGNOSIS — I219 Acute myocardial infarction, unspecified: Secondary | ICD-10-CM | POA: Insufficient documentation

## 2020-04-12 ENCOUNTER — Other Ambulatory Visit: Payer: Self-pay

## 2020-04-12 ENCOUNTER — Encounter: Payer: Self-pay | Admitting: Cardiology

## 2020-04-12 ENCOUNTER — Ambulatory Visit (INDEPENDENT_AMBULATORY_CARE_PROVIDER_SITE_OTHER): Payer: Medicare Other | Admitting: Cardiology

## 2020-04-12 VITALS — BP 136/78 | HR 76 | Ht 71.5 in | Wt 225.4 lb

## 2020-04-12 DIAGNOSIS — I2510 Atherosclerotic heart disease of native coronary artery without angina pectoris: Secondary | ICD-10-CM

## 2020-04-12 DIAGNOSIS — F17210 Nicotine dependence, cigarettes, uncomplicated: Secondary | ICD-10-CM | POA: Diagnosis not present

## 2020-04-12 DIAGNOSIS — E785 Hyperlipidemia, unspecified: Secondary | ICD-10-CM | POA: Diagnosis not present

## 2020-04-12 DIAGNOSIS — Z951 Presence of aortocoronary bypass graft: Secondary | ICD-10-CM | POA: Diagnosis not present

## 2020-04-12 DIAGNOSIS — F17200 Nicotine dependence, unspecified, uncomplicated: Secondary | ICD-10-CM

## 2020-04-12 DIAGNOSIS — Z9861 Coronary angioplasty status: Secondary | ICD-10-CM

## 2020-04-12 DIAGNOSIS — I10 Essential (primary) hypertension: Secondary | ICD-10-CM | POA: Diagnosis not present

## 2020-04-12 NOTE — Progress Notes (Signed)
Cardiology Office Note:    Date:  04/12/2020   ID:  Noah Walker, DOB 1950-10-05, MRN [id number]  PCP:  Titus Dubin., MD  Cardiologist:  Garwin Brothers, MD   Referring MD: Titus Dubin., MD    ASSESSMENT:    1. CAD S/P percutaneous coronary angioplasty   2. Essential hypertension   3. Hyperlipidemia with target LDL less than 70 - Reluctant to take medications   4. S/P CABG (coronary artery bypass graft)   5. Smoking    PLAN:    In order of problems listed above:  1. Coronary artery disease: Secondary prevention stressed with the patient.  Importance of compliance with diet medication stressed and he vocalized understanding 2. Essential hypertension: Blood pressure stable.  Diet was emphasized 3. Mixed dyslipidemia: I told him to come back in the next few days for fasting lipids.  Statins were discussed but is not keen on it.  I respect his wishes.  Risks explained and he vocalized understanding. 4. Cigarette smoker: I spent 5 minutes with the patient discussing solely about smoking. Smoking cessation was counseled. I suggested to the patient also different medications and pharmacological interventions. Patient is keen to try stopping on its own at this time. He will get back to me if he needs any further assistance in this matter. 5. Patient will be seen in follow-up appointment in 6 months or earlier if the patient has any concerns    Medication Adjustments/Labs and Tests Ordered: Current medicines are reviewed at length with the patient today.  Concerns regarding medicines are outlined above.  No orders of the defined types were placed in this encounter.  No orders of the defined types were placed in this encounter.    No chief complaint on file.    History of Present Illness:    Noah Walker is a 69 y.o. male.  Patient has past medical history of coronary artery disease post CABG surgery and intervention, essential hypertension dyslipidemia and  unfortunately continues to smoke.  He denies any chest pain orthopnea or PND.  He takes care of activities of daily living.  He does not exercise on a regular basis.  He mentions to me that he is not keen on statin therapy.  He is in general not very compliant with medical advice.  He tells me that he continues to use alcohol but not much.  At the time of my evaluation, the patient is alert awake oriented and in no distress.  Past Medical History:  Diagnosis Date  . Alcohol use 02/09/2015  . CAD S/P percutaneous coronary angioplasty 05/19/2015  . Coronary artery disease involving coronary bypass graft of native heart with angina pectoris (HCC) 07/09/2017  . Essential hypertension 02/09/2015  . Hyperlipidemia with target LDL less than 70 - Reluctant to take medications 02/09/2015  . MI (myocardial infarction) (HCC)   . Reflux   . S/P CABG (coronary artery bypass graft) 05/22/2017  . Smoking 02/09/2015    Past Surgical History:  Procedure Laterality Date  . CARDIAC CATHETERIZATION    . CARDIAC CATHETERIZATION N/A 02/07/2015   Procedure: Left Heart Cath and Coronary Angiography;  Surgeon: Corky Crafts, MD;  [facility] CV LAB;  Service: Cardiovascular;  Laterality: N/A;  . CARDIAC CATHETERIZATION  02/07/2015   Procedure: Coronary Balloon Angioplasty;  Surgeon: Corky Crafts, MD;  Location: [facility] INVASIVE CV LAB;  Service: Cardiovascular;;  . CORONARY ANGIOPLASTY    . CORONARY ARTERY BYPASS GRAFT N/A 05/22/2017  Procedure: CORONARY ARTERY BYPASS GRAFTING (CABG) X 2 WITH ENDOSCOPIC HARVESTING OF RIGHT SAPHENOUS VEIN.LIMA-LAD SVG-RCA;  Surgeon: Donata Clay, Theron Arista, MD;  Location: [facility] OR;  Service: Open Heart Surgery;  Laterality: N/A;  . LEFT HEART CATH AND CORONARY ANGIOGRAPHY N/A 05/18/2017   Procedure: LEFT HEART CATH AND CORONARY ANGIOGRAPHY;  Surgeon: Dolores Patty, MD;  Location: [facility] INVASIVE CV LAB;  Service: Cardiovascular;  Laterality: N/A;  . TEE WITHOUT CARDIOVERSION  N/A 05/22/2017   Procedure: TRANSESOPHAGEAL ECHOCARDIOGRAM (TEE);  Surgeon: Donata Clay, Theron Arista, MD;  Location: [facility] OR;  Service: Open Heart Surgery;  Laterality: N/A;    Current Medications: Current Meds  Medication Sig  . aspirin 81 MG tablet Take 81 mg by mouth daily.  . Omega-3 Fatty Acids (FISH OIL) 1000 MG CAPS Take 1,000 mg by mouth daily.     Allergies:   Other and Statins   Social History   Socioeconomic History  . Marital status: Unknown    Spouse name: Not on file  . Number of children: Not on file  . Years of education: Not on file  . Highest education level: Not on file  Occupational History  . Not on file  Tobacco Use  . Smoking status: Current Every Day Smoker    Packs/day: 0.50    Types: Cigarettes    Start date: 72  . Smokeless tobacco: Never Used  Substance and Sexual Activity  . Alcohol use: Yes  . Drug use: No  . Sexual activity: Not on file  Other Topics Concern  . Not on file  Social History Narrative  . Not on file   Social Determinants of Health   Financial Resource Strain:   . Difficulty of Paying Living Expenses: Not on file  Food Insecurity:   . Worried About Programme researcher, broadcasting/film/video in the Last Year: Not on file  . Ran Out of Food in the Last Year: Not on file  Transportation Needs:   . Lack of Transportation (Medical): Not on file  . Lack of Transportation (Non-Medical): Not on file  Physical Activity:   . Days of Exercise per Week: Not on file  . Minutes of Exercise per Session: Not on file  Stress:   . Feeling of Stress : Not on file  Social Connections:   . Frequency of Communication with Friends and Family: Not on file  . Frequency of Social Gatherings with Friends and Family: Not on file  . Attends Religious Services: Not on file  . Active Member of Clubs or Organizations: Not on file  . Attends Banker Meetings: Not on file  . Marital Status: Not on file     Family History: The patient's family history is negative  for CAD and Stroke.  ROS:   Please see the history of present illness.    All other systems reviewed and are negative.  EKGs/Labs/Other Studies Reviewed:    The following studies were reviewed today: I discussed my findings with the patient at extensive length.   Recent Labs: 08/02/2019: ALT 18; BUN 11; Creatinine, Ser 1.08; Hemoglobin 15.3; Platelets 167; Potassium 4.0; Sodium 137; TSH 3.330  Recent Lipid Panel    Component Value Date/Time   CHOL 188 08/02/2019 0813   TRIG 123 08/02/2019 0813   HDL 43 08/02/2019 0813   CHOLHDL 4.4 08/02/2019 0813   CHOLHDL 4.7 05/19/2017 0539   VLDL 34 05/19/2017 0539   LDLCALC 123 (H) 08/02/2019 0813    Physical Exam:    VS:  BP 136/78   Pulse 76   Ht 5' 11.5" (1.816 m)   Wt 225 lb 6.4 oz (102.2 kg)   SpO2 94%   BMI 31.00 kg/m     Wt Readings from Last 3 Encounters:  04/12/20 225 lb 6.4 oz (102.2 kg)  08/01/19 225 lb (102.1 kg)  03/25/18 213 lb 12.8 oz (97 kg)     GEN: Patient is in no acute distress HEENT: Normal NECK: No JVD; No carotid bruits LYMPHATICS: No lymphadenopathy CARDIAC: Hear sounds regular, 2/6 systolic murmur at the apex. RESPIRATORY:  Clear to auscultation without rales, wheezing or rhonchi  ABDOMEN: Soft, non-tender, non-distended MUSCULOSKELETAL:  No edema; No deformity  SKIN: Warm and dry NEUROLOGIC:  Alert and oriented x 3 PSYCHIATRIC:  Normal affect   Signed, Garwin Brothers, MD  04/12/2020 11:13 AM    [facility]

## 2020-04-12 NOTE — Patient Instructions (Addendum)
Medication Instructions:  No medication changes. *If you need a refill on your cardiac medications before your next appointment, please call your pharmacy*   Lab Work: Your physician recommends that you have labs done in the next few days. Your test included  basic metabolic panel, complete blood count, TSH, liver function and lipids.  You need to have labs done when you are fasting.  You can come Monday through Friday 8:30 am to 12:00 pm and 1:15 to 4:30. You do not need to make an appointment as the order has already been placed.    If you have labs (blood work) drawn today and your tests are completely normal, you will receive your results only by: . MyChart Message (if you have MyChart) OR . A paper copy in the mail If you have any lab test that is abnormal or we need to change your treatment, we will call you to review the results.   Testing/Procedures: None ordered   Follow-Up: At [facility], you and your health needs are our priority.  As part of our continuing mission to provide you with exceptional heart care, we have created designated Provider Care Teams.  These Care Teams include your primary Cardiologist (physician) and Advanced Practice Providers (APPs -  Physician Assistants and Nurse Practitioners) who all work together to provide you with the care you need, when you need it.  We recommend signing up for the patient portal called "MyChart".  Sign up information is provided on this After Visit Summary.  MyChart is used to connect with patients for Virtual Visits (Telemedicine).  Patients are able to view lab/test results, encounter notes, upcoming appointments, etc.  Non-urgent messages can be sent to your provider as well.   To learn more about what you can do with MyChart, go to [email or web address].    Your next appointment:   6 month(s)  The format for your next appointment:   In Person  Provider:   Rajan Revankar, MD   Other Instructions NA  

## 2020-10-01 ENCOUNTER — Other Ambulatory Visit: Payer: Self-pay

## 2020-10-01 ENCOUNTER — Encounter: Payer: Self-pay | Admitting: Cardiology

## 2020-10-01 ENCOUNTER — Ambulatory Visit (INDEPENDENT_AMBULATORY_CARE_PROVIDER_SITE_OTHER): Payer: Medicare Other | Admitting: Cardiology

## 2020-10-01 VITALS — BP 134/90 | HR 75 | Ht 72.0 in | Wt 221.8 lb

## 2020-10-01 DIAGNOSIS — F17200 Nicotine dependence, unspecified, uncomplicated: Secondary | ICD-10-CM

## 2020-10-01 DIAGNOSIS — E785 Hyperlipidemia, unspecified: Secondary | ICD-10-CM

## 2020-10-01 DIAGNOSIS — Z951 Presence of aortocoronary bypass graft: Secondary | ICD-10-CM

## 2020-10-01 DIAGNOSIS — I10 Essential (primary) hypertension: Secondary | ICD-10-CM | POA: Diagnosis not present

## 2020-10-01 DIAGNOSIS — I25709 Atherosclerosis of coronary artery bypass graft(s), unspecified, with unspecified angina pectoris: Secondary | ICD-10-CM

## 2020-10-01 NOTE — Addendum Note (Signed)
Addended by: Eleonore Chiquito on: 10/01/2020 10:49 AM   Modules accepted: Orders

## 2020-10-01 NOTE — Patient Instructions (Signed)
Medication Instructions:  No medication changes. *If you need a refill on your cardiac medications before your next appointment, please call your pharmacy*   Lab Work: Your physician recommends that you return for lab work in: tomorrow. You need to have labs done when you are fasting.  You can come Monday through Friday 8:30 am to 12:00 pm and 1:15 to 4:30. You do not need to make an appointment as the order has already been placed. The labs you are going to have done are BMET, CBC, TSH, LFT and Lipids.  If you have labs (blood work) drawn today and your tests are completely normal, you will receive your results only by: Marland Kitchen MyChart Message (if you have MyChart) OR . A paper copy in the mail If you have any lab test that is abnormal or we need to change your treatment, we will call you to review the results.   Testing/Procedures: None ordered   Follow-Up: At [facility], you and your health needs are our priority.  As part of our continuing mission to provide you with exceptional heart care, we have created designated Provider Care Teams.  These Care Teams include your primary Cardiologist (physician) and Advanced Practice Providers (APPs -  Physician Assistants and Nurse Practitioners) who all work together to provide you with the care you need, when you need it.  We recommend signing up for the patient portal called "MyChart".  Sign up information is provided on this After Visit Summary.  MyChart is used to connect with patients for Virtual Visits (Telemedicine).  Patients are able to view lab/test results, encounter notes, upcoming appointments, etc.  Non-urgent messages can be sent to your provider as well.   To learn more about what you can do with MyChart, go to [email or web address].    Your next appointment:   1 month(s)  The format for your next appointment:   In Person  Provider:   Belva Crome, MD   Other Instructions NA

## 2020-10-01 NOTE — Progress Notes (Signed)
Cardiology Office Note:    Date:  10/01/2020   ID:  Noah Walker, DOB 08/29/1950, MRN [id number]  PCP:  Titus Dubin., MD  Cardiologist:  Garwin Brothers, MD   Referring MD: Titus Dubin., MD    ASSESSMENT:    1. Coronary artery disease involving coronary bypass graft of native heart with angina pectoris (HCC)   2. Essential hypertension   3. Hyperlipidemia with target LDL less than 70 - Reluctant to take medications   4. S/P CABG (coronary artery bypass graft)   5. Smoking    PLAN:    In order of problems listed above:  1. Coronary artery disease: Secondary prevention stressed with the patient.  Importance of compliance with diet medication stressed any vocalized understanding.  I told him to walk at least half an hour a day on a regular basis and he promises to try. 2. Cigarette smoker: I spent 5 minutes with the patient discussing solely about smoking. Smoking cessation was counseled. I suggested to the patient also different medications and pharmacological interventions. Patient is keen to try stopping on its own at this time. He will get back to me if he needs any further assistance in this matter. 3. Essential hypertension: Blood pressure stable and diet was emphasized.  Lifestyle modification was urged. 4. Mixed dyslipidemia: He will be back tomorrow morning for blood work.  He is willing to try Livalo low-dose.  He is not tolerated statins well in the past because of muscle aches.  But he is willing to try Livalo.  He will take coenzyme Q 10 if the stress level evaluate for blood work reports to advise him about this.  Benefits and risks explained any vocalized understanding. 5. Patient will be seen in follow-up appointment in 6 months or earlier if the patient has any concerns    Medication Adjustments/Labs and Tests Ordered: Current medicines are reviewed at length with the patient today.  Concerns regarding medicines are outlined above.  No orders of the  defined types were placed in this encounter.  No orders of the defined types were placed in this encounter.    No chief complaint on file.    History of Present Illness:    Noah Walker is a 70 y.o. male.  Patient has past medical history of coronary artery disease post CABG surgery, essential hypertension, mixed dyslipidemia and smoking.  He denies any problems at this time.  He works in [employer].  He is noncompliant with medical advice.  He does not exercise on a regular basis.  Unfortunately continues to smoke.  At the time of my evaluation, the patient is alert awake oriented and in no distress.  Past Medical History:  Diagnosis Date  . Alcohol use 02/09/2015  . CAD S/P percutaneous coronary angioplasty 05/19/2015  . Coronary artery disease involving coronary bypass graft of native heart with angina pectoris (HCC) 07/09/2017  . Essential hypertension 02/09/2015  . Hyperlipidemia with target LDL less than 70 - Reluctant to take medications 02/09/2015  . MI (myocardial infarction) (HCC)   . Reflux   . S/P CABG (coronary artery bypass graft) 05/22/2017  . Smoking 02/09/2015    Past Surgical History:  Procedure Laterality Date  . CARDIAC CATHETERIZATION    . CARDIAC CATHETERIZATION N/A 02/07/2015   Procedure: Left Heart Cath and Coronary Angiography;  Surgeon: Corky Crafts, MD;  Location: [facility] INVASIVE CV LAB;  Service: Cardiovascular;  Laterality: N/A;  . CARDIAC CATHETERIZATION  02/07/2015   Procedure:  Coronary Balloon Angioplasty;  Surgeon: Corky Crafts, MD;  Location: [facility] INVASIVE CV LAB;  Service: Cardiovascular;;  . CORONARY ANGIOPLASTY    . CORONARY ARTERY BYPASS GRAFT N/A 05/22/2017   Procedure: CORONARY ARTERY BYPASS GRAFTING (CABG) X 2 WITH ENDOSCOPIC HARVESTING OF RIGHT SAPHENOUS VEIN.LIMA-LAD SVG-RCA;  Surgeon: Donata Clay, Theron Arista, MD;  Location: [facility] OR;  Service: Open Heart Surgery;  Laterality: N/A;  . LEFT HEART CATH AND CORONARY ANGIOGRAPHY N/A 05/18/2017    Procedure: LEFT HEART CATH AND CORONARY ANGIOGRAPHY;  Surgeon: Dolores Patty, MD;  Location: [facility] INVASIVE CV LAB;  Service: Cardiovascular;  Laterality: N/A;  . TEE WITHOUT CARDIOVERSION N/A 05/22/2017   Procedure: TRANSESOPHAGEAL ECHOCARDIOGRAM (TEE);  Surgeon: Donata Clay, Theron Arista, MD;  Location: [facility] OR;  Service: Open Heart Surgery;  Laterality: N/A;    Current Medications: Current Meds  Medication Sig  . aspirin 81 MG tablet Take 81 mg by mouth daily.  . nitroGLYCERIN (NITROSTAT) 0.4 MG SL tablet Place 0.4 mg under the tongue every 5 (five) minutes as needed for chest pain.     Allergies:   Other and Statins   Social History   Socioeconomic History  . Marital status: Unknown    Spouse name: Not on file  . Number of children: Not on file  . Years of education: Not on file  . Highest education level: Not on file  Occupational History  . Not on file  Tobacco Use  . Smoking status: Current Every Day Smoker    Packs/day: 0.50    Types: Cigarettes    Start date: 64  . Smokeless tobacco: Never Used  Substance and Sexual Activity  . Alcohol use: Yes  . Drug use: No  . Sexual activity: Not on file  Other Topics Concern  . Not on file  Social History Narrative  . Not on file   Social Determinants of Health   Financial Resource Strain: Not on file  Food Insecurity: Not on file  Transportation Needs: Not on file  Physical Activity: Not on file  Stress: Not on file  Social Connections: Not on file     Family History: The patient's family history is negative for CAD and Stroke.  ROS:   Please see the history of present illness.    All other systems reviewed and are negative.  EKGs/Labs/Other Studies Reviewed:    The following studies were reviewed today: EKG reveals sinus rhythm inferior wall myocardial infarction of undetermined age and nonspecific ST-T changes   Recent Labs: No results found for requested labs within last 8760 hours.  Recent Lipid Panel     Component Value Date/Time   CHOL 188 08/02/2019 0813   TRIG 123 08/02/2019 0813   HDL 43 08/02/2019 0813   CHOLHDL 4.4 08/02/2019 0813   CHOLHDL 4.7 05/19/2017 0539   VLDL 34 05/19/2017 0539   LDLCALC 123 (H) 08/02/2019 0813    Physical Exam:    VS:  BP 134/90   Pulse 75   Ht 6' (1.829 m)   Wt 221 lb 12.8 oz (100.6 kg)   SpO2 94%   BMI 30.08 kg/m     Wt Readings from Last 3 Encounters:  10/01/20 221 lb 12.8 oz (100.6 kg)  04/12/20 225 lb 6.4 oz (102.2 kg)  08/01/19 225 lb (102.1 kg)     GEN: Patient is in no acute distress HEENT: Normal NECK: No JVD; No carotid bruits LYMPHATICS: No lymphadenopathy CARDIAC: Hear sounds regular, 2/6 systolic murmur at the apex. RESPIRATORY:  Clear to auscultation without rales, wheezing or rhonchi  ABDOMEN: Soft, non-tender, non-distended MUSCULOSKELETAL:  No edema; No deformity  SKIN: Warm and dry NEUROLOGIC:  Alert and oriented x 3 PSYCHIATRIC:  Normal affect   Signed, Garwin Brothers, MD  10/01/2020 10:37 AM    [facility]

## 2020-10-03 LAB — CBC WITH DIFFERENTIAL/PLATELET
Basophils Absolute: 0.1 10*3/uL (ref 0.0–0.2)
Basos: 1 %
EOS (ABSOLUTE): 0.1 10*3/uL (ref 0.0–0.4)
Eos: 2 %
Hematocrit: 45.3 % (ref 37.5–51.0)
Hemoglobin: 15.2 g/dL (ref 13.0–17.7)
Immature Grans (Abs): 0 10*3/uL (ref 0.0–0.1)
Immature Granulocytes: 0 %
Lymphocytes Absolute: 2.5 10*3/uL (ref 0.7–3.1)
Lymphs: 40 %
MCH: 31.9 pg (ref 26.6–33.0)
MCHC: 33.6 g/dL (ref 31.5–35.7)
MCV: 95 fL (ref 79–97)
Monocytes Absolute: 0.7 10*3/uL (ref 0.1–0.9)
Monocytes: 11 %
Neutrophils Absolute: 2.9 10*3/uL (ref 1.4–7.0)
Neutrophils: 46 %
Platelets: 188 10*3/uL (ref 150–450)
RBC: 4.76 x10E6/uL (ref 4.14–5.80)
RDW: 12.7 % (ref 11.6–15.4)
WBC: 6.3 10*3/uL (ref 3.4–10.8)

## 2020-10-03 LAB — BASIC METABOLIC PANEL
BUN/Creatinine Ratio: 12 (ref 10–24)
BUN: 12 mg/dL (ref 8–27)
CO2: 23 mmol/L (ref 20–29)
Calcium: 9.6 mg/dL (ref 8.6–10.2)
Chloride: 100 mmol/L (ref 96–106)
Creatinine, Ser: 1.03 mg/dL (ref 0.76–1.27)
Glucose: 102 mg/dL — ABNORMAL HIGH (ref 65–99)
Potassium: 4.5 mmol/L (ref 3.5–5.2)
Sodium: 136 mmol/L (ref 134–144)
eGFR: 79 mL/min/{1.73_m2} (ref >59)

## 2020-10-03 LAB — HEPATIC FUNCTION PANEL
ALT: 20 IU/L (ref 0–44)
AST: 15 IU/L (ref 0–40)
Albumin: 4.4 g/dL (ref 3.8–4.8)
Alkaline Phosphatase: 82 IU/L (ref 44–121)
Bilirubin Total: 0.5 mg/dL (ref 0.0–1.2)
Bilirubin, Direct: 0.11 mg/dL (ref 0.00–0.40)
Total Protein: 7 g/dL (ref 6.0–8.5)

## 2020-10-03 LAB — TSH: TSH: 3.99 u[IU]/mL (ref 0.450–4.500)

## 2020-10-03 LAB — LIPID PANEL
Chol/HDL Ratio: 5.7 ratio — ABNORMAL HIGH (ref 0.0–5.0)
Cholesterol, Total: 232 mg/dL — ABNORMAL HIGH (ref 100–199)
HDL: 41 mg/dL (ref >39)
LDL Chol Calc (NIH): 151 mg/dL — ABNORMAL HIGH (ref 0–99)
Triglycerides: 219 mg/dL — ABNORMAL HIGH (ref 0–149)
VLDL Cholesterol Cal: 40 mg/dL (ref 5–40)

## 2020-10-04 MED ORDER — NEXLETOL 180 MG PO TABS
1.0000 | ORAL_TABLET | Freq: Every day | ORAL | 3 refills | Status: AC
Start: 2020-10-04 — End: ?

## 2020-10-04 NOTE — Addendum Note (Signed)
Addended by: Eleonore Chiquito on: 10/04/2020 10:40 AM   Modules accepted: Orders

## 2020-11-14 DIAGNOSIS — N529 Male erectile dysfunction, unspecified: Secondary | ICD-10-CM

## 2020-11-14 HISTORY — DX: Male erectile dysfunction, unspecified: N52.9

## 2021-04-04 ENCOUNTER — Ambulatory Visit: Payer: Medicare Other | Admitting: Cardiology

## 2021-05-06 ENCOUNTER — Other Ambulatory Visit: Payer: Self-pay | Admitting: Cardiology

## 2021-05-06 NOTE — Telephone Encounter (Signed)
Nitroglycerin 0.4 mg sl tablets # 25 x 3 refills sent to [facility] - [place], [place] - [address]

## 2021-05-09 ENCOUNTER — Other Ambulatory Visit: Payer: Self-pay

## 2021-05-23 ENCOUNTER — Ambulatory Visit: Payer: Medicare Other | Admitting: Cardiology

## 2023-05-07 ENCOUNTER — Other Ambulatory Visit: Payer: Self-pay | Admitting: Cardiology

## 2023-05-07 NOTE — Telephone Encounter (Signed)
Rx refill sent to pharmacy.
